# Patient Record
Sex: Male | Born: 1990 | Race: Black or African American | Hispanic: No | Marital: Single | Smoking: Never smoker
Health system: Southern US, Community
[De-identification: ages and names within clinical notes are randomized; demographics above are authoritative.]

---
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
## 2012-06-18 ENCOUNTER — Emergency Department (HOSPITAL_COMMUNITY)
Admission: EM | Admit: 2012-06-18 | Discharge: 2012-06-18 | Disposition: A | Discharge status: Home / Self Care | Payer: Self-pay | Attending: Emergency Medicine | Admitting: Emergency Medicine

## 2012-06-18 ENCOUNTER — Encounter (HOSPITAL_COMMUNITY): Payer: Self-pay | Admitting: Emergency Medicine

## 2012-06-18 DIAGNOSIS — Y9367 Activity, basketball: Secondary | ICD-10-CM | POA: Insufficient documentation

## 2012-06-18 DIAGNOSIS — Y92838 Other recreation area as the place of occurrence of the external cause: Secondary | ICD-10-CM | POA: Insufficient documentation

## 2012-06-18 DIAGNOSIS — W1809XA Striking against other object with subsequent fall, initial encounter: Secondary | ICD-10-CM | POA: Insufficient documentation

## 2012-06-18 DIAGNOSIS — S0180XA Unspecified open wound of other part of head, initial encounter: Secondary | ICD-10-CM | POA: Insufficient documentation

## 2012-06-18 DIAGNOSIS — Y9239 Other specified sports and athletic area as the place of occurrence of the external cause: Secondary | ICD-10-CM | POA: Insufficient documentation

## 2012-06-18 DIAGNOSIS — S0181XA Laceration without foreign body of other part of head, initial encounter: Secondary | ICD-10-CM

## 2012-06-18 MED ORDER — BACITRACIN-NEOMYCIN-POLYMYXIN OINTMENT TUBE
TOPICAL_OINTMENT | Freq: Once | CUTANEOUS | Status: AC
  Administered 2012-06-18: 22:00:00 via TOPICAL
  Filled 2012-06-18: qty 15

## 2012-06-18 NOTE — ED Notes (Signed)
PT. PRESENTS WITH UPPER LIP LACERATION APPROX. 1 INCH LENGTH , TRIPPED AND FELL WHILE PLAYING BASKETBALL -HIT MOUTH AGAINST BLEACHER BENCH , NO LOC . BLEEDING CONTROLLED.

## 2012-06-18 NOTE — ED Notes (Signed)
Patient was playing basketball and he was chasing the ball to keep it from going out of bounds, and he hit a metal bleacher and it lacerated his face.  It is a deep laceration to the area between his upper lip and the tip of his nose.

## 2012-06-18 NOTE — ED Provider Notes (Signed)
History     CSN: [id number]  Arrival date & time 06/18/12  2039   First MD Initiated Contact with Patient 06/18/12 2118      Chief Complaint  Patient presents with  . Lip Laceration    (Consider location/radiation/quality/duration/timing/severity/associated sxs/prior treatment) HPI Comments: Patient states he was playing basketball, when he went to go after a ball that was going out of bounds.  He tripped and fell into the bleachers turning his head to the right, causing him to hit his face on the step in his ear on a support.  He now has a laceration to his upper lip between the nasal pinna and vermilion border as well as to the ear, where the, ear attaches to the scalp  The history is provided by the patient.    History reviewed. No pertinent past medical history.  History reviewed. No pertinent past surgical history.  No family history on file.  History  Substance Use Topics  . Smoking status: Never Smoker   . Smokeless tobacco: Not on file  . Alcohol Use: No      Review of Systems  Constitutional: Negative for activity change.  HENT: Negative for ear pain, neck pain and ear discharge.   Eyes: Negative for visual disturbance.  Respiratory: Negative.   Cardiovascular: Negative.   Gastrointestinal: Negative.   Skin: Positive for wound.  Neurological: Negative for dizziness and headaches.  All other systems reviewed and are negative.    Allergies  Review of patient's allergies indicates no known allergies.  Home Medications  No current outpatient prescriptions on file.  BP 118/71  Pulse 60  Temp 99.5 F (37.5 C) (Oral)  Resp 14  SpO2 100%  Physical Exam  Nursing note and vitals reviewed. Constitutional: He appears well-developed and well-nourished.  HENT:  Head:    Ears:  Eyes: Pupils are equal, round, and reactive to light.  Neck: Normal range of motion.  Cardiovascular: Normal rate.   Pulmonary/Chest: Effort normal.  Musculoskeletal: Normal  range of motion.  Neurological: He is alert.  Skin: Skin is warm and dry.    ED Course  LACERATION REPAIR Date/Time: 06/18/2012 10:08 PM Performed by: Arman Filter Authorized by: Arman Filter Consent: Verbal consent obtained. Risks and benefits: risks, benefits and alternatives were discussed Consent given by: patient Patient understanding: patient states understanding of the procedure being performed Patient identity confirmed: verbally with patient Time out: Immediately prior to procedure a "time out" was called to verify the correct patient, procedure, equipment, support staff and site/side marked as required. Body area: head/neck Location details: nose Laceration length: 1.5 cm Foreign bodies: unknown Tendon involvement: none Nerve involvement: none Vascular damage: no Anesthesia: local infiltration Local anesthetic: lidocaine 1% without epinephrine Anesthetic total: 3 ml Patient sedated: no Irrigation solution: saline Amount of cleaning: standard Debridement: none Degree of undermining: none Skin closure: 6-0 Prolene Number of sutures: 8 Technique: simple Approximation: close Approximation difficulty: simple Dressing: antibiotic ointment Patient tolerance: Patient tolerated the procedure well with no immediate complications.   (including critical care time)  Labs Reviewed - No data to display No results found. LACERATION REPAIR Performed by: Arman Filter Authorized by: Arman Filter Consent: Verbal consent obtained. Risks and benefits: risks, benefits and alternatives were discussed Consent given by: patient Patient identity confirmed: provided demographic data Prepped and Draped in normal sterile fashion Wound explored  Laceration Location: ear  Laceration Length: .75 cm  No Foreign Bodies seen or palpated  Anesthesia: local infiltration  Local anesthetic:  lidocaine 1% woepinephrine  Anesthetic total: 1ml  Irrigation method: syringe Amount  of cleaning: standard  Skin closure:simple  Number of sutures: 3  Technique: interupted  Patient tolerance: Patient tolerated the procedure well with no immediate complications.   1. Laceration of face       MDM  Lacerations         Arman Filter, NP 06/18/12 2214  Arman Filter, NP 06/18/12 2215

## 2012-06-18 NOTE — ED Notes (Signed)
Applied ointment to pts lacerations as instructed by Kathrine Cords., NP. Pt given d/c teaching. Pt has no further questions upon d/c. Pt ambulatory. Pt does not appear to be in any acute distress upon d/c.

## 2012-06-19 NOTE — ED Provider Notes (Signed)
Medical screening examination/treatment/procedure(s) were performed by non-physician practitioner and as supervising physician I was immediately available for consultation/collaboration.  Flint Melter, MD 06/19/12 [phone]

## 2012-06-25 ENCOUNTER — Encounter (HOSPITAL_COMMUNITY): Payer: Self-pay | Admitting: *Deleted

## 2012-06-25 ENCOUNTER — Emergency Department (HOSPITAL_COMMUNITY)
Admission: EM | Admit: 2012-06-25 | Discharge: 2012-06-25 | Disposition: A | Discharge status: Home / Self Care | Payer: Self-pay | Attending: Emergency Medicine | Admitting: Emergency Medicine

## 2012-06-25 DIAGNOSIS — Z4802 Encounter for removal of sutures: Secondary | ICD-10-CM | POA: Insufficient documentation

## 2012-06-25 NOTE — ED Notes (Signed)
Pt incurred a lip laceration on 08/20/11. Received stitches and was told to come back in 7 days to have them removed.

## 2012-06-25 NOTE — ED Provider Notes (Signed)
History    This chart was scribed for Geoffery Lyons, MD, MD by Smitty Pluck, ED Scribe. The patient was seen in room [facility] and the patient's care was started at 3:40PM.   CSN: [id number]  Arrival date & time 06/25/12  1520   None     Chief Complaint  Patient presents with  . Suture / Staple Removal    (Consider location/radiation/quality/duration/timing/severity/associated sxs/prior treatment) The history is provided by the patient. No language interpreter was used.   Cesar Warner is a 21 y.o. male who presents to the Emergency Department due to suture removal from lip. Pt had 8 sutures placed 1 week ago in ED. Pt reports that he tripped and fell into bleachers while playing basketball causing the lip laceration. Pt denies any drainage from area, fever, signs of infection and any other symptoms currently.   History reviewed. No pertinent past medical history.  History reviewed. No pertinent past surgical history.  No family history on file.  History  Substance Use Topics  . Smoking status: Never Smoker   . Smokeless tobacco: Not on file  . Alcohol Use: No      Review of Systems  Constitutional: Negative for fever and chills.  Respiratory: Negative for shortness of breath.   Gastrointestinal: Negative for nausea and vomiting.  Neurological: Negative for weakness.  All other systems reviewed and are negative.    Allergies  Review of patient's allergies indicates no known allergies.  Home Medications  No current outpatient prescriptions on file.  BP 113/77  Pulse 50  Temp 97 F (36.1 C) (Oral)  Resp 16  Ht 6\' 2"  (1.88 m)  Wt 147 lb (66.679 kg)  BMI 18.87 kg/m2  SpO2 100%  Physical Exam  Nursing note and vitals reviewed. Constitutional: He is oriented to person, place, and time. He appears well-developed and well-nourished. No distress.  HENT:  Head: Normocephalic and atraumatic.       Well healed laceration  No signs of infection No erythema  8  sutures in place    Eyes: EOM are normal.  Neck: Neck supple. No tracheal deviation present.  Cardiovascular: Normal rate.   Pulmonary/Chest: Effort normal. No respiratory distress.  Musculoskeletal: Normal range of motion.  Neurological: He is alert and oriented to person, place, and time.  Skin: Skin is warm and dry.  Psychiatric: He has a normal mood and affect. His behavior is normal.    ED Course  Procedures (including critical care time) DIAGNOSTIC STUDIES: Oxygen Saturation is 100% on room air, normal by my interpretation.    COORDINATION OF CARE: 3:43 PM Discussed ED treatment with pt     Labs Reviewed - No data to display No results found.   No diagnosis found.    MDM  Sutures removed, lac looks good.  Return prn.   I personally performed the services described in this documentation, which was scribed in my presence. The recorded information has been reviewed and is accurate.         Geoffery Lyons, MD 06/25/12 [phone]

## 2012-08-22 ENCOUNTER — Emergency Department (HOSPITAL_COMMUNITY)
Admission: EM | Admit: 2012-08-22 | Discharge: 2012-08-22 | Disposition: A | Discharge status: Home / Self Care | Payer: Self-pay | Attending: Emergency Medicine | Admitting: Emergency Medicine

## 2012-08-22 ENCOUNTER — Encounter (HOSPITAL_COMMUNITY): Payer: Self-pay | Admitting: *Deleted

## 2012-08-22 ENCOUNTER — Encounter (HOSPITAL_COMMUNITY): Payer: Self-pay | Admitting: Nurse Practitioner

## 2012-08-22 DIAGNOSIS — Z202 Contact with and (suspected) exposure to infections with a predominantly sexual mode of transmission: Secondary | ICD-10-CM | POA: Insufficient documentation

## 2012-08-22 DIAGNOSIS — Z7251 High risk heterosexual behavior: Secondary | ICD-10-CM | POA: Insufficient documentation

## 2012-08-22 MED ORDER — CEFTRIAXONE SODIUM 1 G IJ SOLR
1.0000 g | Freq: Once | INTRAMUSCULAR | Status: DC
  Filled 2012-08-22: qty 10

## 2012-08-22 MED ORDER — LIDOCAINE HCL (PF) 1 % IJ SOLN
INTRAMUSCULAR | Status: AC
  Administered 2012-08-22: 5 mL
  Filled 2012-08-22: qty 5

## 2012-08-22 MED ORDER — AZITHROMYCIN 1 G PO PACK
1.0000 g | PACK | Freq: Once | ORAL | Status: DC
  Filled 2012-08-22: qty 1

## 2012-08-22 NOTE — ED Provider Notes (Signed)
  Medical screening examination/treatment/procedure(s) were performed by non-physician practitioner and as supervising physician I was immediately available for consultation/collaboration.    Crysten Kaman, MD 08/22/12 2311 

## 2012-08-22 NOTE — ED Provider Notes (Signed)
  Medical screening examination/treatment/procedure(s) were performed by non-physician practitioner and as supervising physician I was immediately available for consultation/collaboration.    Graceanne Guin, MD 08/22/12 2311 

## 2012-08-22 NOTE — ED Notes (Signed)
Pt states girlfriend just tested and treated for G/C std's. Pt wants treatment as well. Pt and girlfriend in long term relationship. Pt denies painful urination or discharge.

## 2012-08-22 NOTE — ED Provider Notes (Signed)
History    This chart was scribed for non-physician practitioner working with Gerhard Munch, MD by Frederik Pear, ED Scribe. This patient was seen in room TR05C/TR05C and the patient's care was started at 1822.   CSN: [id number]  Arrival date & time 08/22/12  1744   First MD Initiated Contact with Patient 08/22/12 1822      No chief complaint on file.   (Consider location/radiation/quality/duration/timing/severity/associated sxs/prior treatment) Patient is a 22 y.o. male presenting with penile discharge. The history is provided by the patient. No language interpreter was used.  Penile Discharge This is a new problem. The current episode started 2 days ago. The problem occurs rarely. The problem has been resolved. Nothing aggravates the symptoms. Nothing relieves the symptoms. He has tried nothing for the symptoms. The treatment provided no relief.    Cesar Warner is a 22 y.o. male who presents to the Emergency Department complaining of sudden onset 1 episode of yellow penile discharge that began 2-3 days ago after having unprotected sex with an individual last week who has seen been diagnosed with chlamydia.Marland Kitchen He denies having additional episodes of penile discharge. He denies any nausea, dysuria, pain, emesis, or h/o of STDs.   History reviewed. No pertinent past medical history.  History reviewed. No pertinent past surgical history.  No family history on file.  History  Substance Use Topics  . Smoking status: Never Smoker   . Smokeless tobacco: Not on file  . Alcohol Use: No      Review of Systems  Gastrointestinal: Negative for nausea and vomiting.  Genitourinary: Positive for discharge. Negative for dysuria.  All other systems reviewed and are negative.    Allergies  Review of patient's allergies indicates no known allergies.  Home Medications  No current outpatient prescriptions on file.  BP 120/88  Pulse 87  Temp(Src) 98.7 F (37.1 C) (Oral)  Resp 18   SpO2 99%  Physical Exam  Nursing note and vitals reviewed. Constitutional: He is oriented to person, place, and time. He appears well-developed and well-nourished. No distress.  HENT:  Head: Normocephalic and atraumatic.  Eyes: EOM are normal. Pupils are equal, round, and reactive to light.  Neck: Normal range of motion. Neck supple. No tracheal deviation present.  Cardiovascular: Normal rate.   Pulmonary/Chest: Effort normal. No respiratory distress.  Abdominal: Soft. He exhibits no distension.  Genitourinary: Testes normal and penis normal. No discharge found.  Musculoskeletal: Normal range of motion. He exhibits no edema.  Lymphadenopathy:       Right: No inguinal adenopathy present.       Left: No inguinal adenopathy present.  Neurological: He is alert and oriented to person, place, and time.  Skin: Skin is warm and dry.  Psychiatric: He has a normal mood and affect. His behavior is normal.    ED Course  Procedures (including critical care time)  DIAGNOSTIC STUDIES: Oxygen Saturation is 99% on room air, normal by my interpretation.    COORDINATION OF CARE:  18:29- Discussed planned course of treatment with the patient, including a GC/chlamydia probe, who is agreeable at this time.   Labs Reviewed - No data to display No results found.   No diagnosis found.  One episode of penile discharge, now resolved.  No dysuria.  Will check urine for GC/Chlamydia--patient will be contacted for treatment options if indicated.  MDM   I personally performed the services described in this documentation, which was scribed in my presence. The recorded information has been reviewed and  is accurate.         Jimmye Norman, NP 08/22/12 2055

## 2012-08-22 NOTE — ED Provider Notes (Signed)
History    This chart was scribed for non-physician practitioner working with Richardean Canal, MD by Frederik Pear, ED Scribe. This patient was seen in room TR10C/TR10C and the patient's care was started at 2219.   CSN: [id number]  Arrival date & time 08/22/12  2155   First MD Initiated Contact with Patient 08/22/12 2219      Chief Complaint  Patient presents with  . SEXUALLY TRANSMITTED DISEASE    (Consider location/radiation/quality/duration/timing/severity/associated sxs/prior treatment) The history is provided by the patient. No language interpreter was used.    Cesar Warner is a 22 y.o. male who presents to the Emergency Department complaining of sudden onset 1 episode of yellow penile discharge that began 2-3 days ago after having unprotected sex earlier today with his gf who was diagnosed with chlamydia. They both deny using any type of birth control. He reports that he was seen in the ED earlier today by Felicie Morn and completed a GC/chlamydia probe. He denies any associated back pain, fever, emesis, or nocturnal hyperhidrosis.  History reviewed. No pertinent past medical history.  History reviewed. No pertinent past surgical history.  History reviewed. No pertinent family history.  History  Substance Use Topics  . Smoking status: Never Smoker   . Smokeless tobacco: Not on file  . Alcohol Use: No      Review of Systems  Constitutional: Negative for fever and chills.  HENT: Negative for sore throat.   Gastrointestinal: Negative for vomiting.  Genitourinary: Positive for discharge.  Musculoskeletal: Negative for back pain.  Skin: Negative for rash.  Neurological: Negative for dizziness.  All other systems reviewed and are negative.    Allergies  Review of patient's allergies indicates no known allergies.  Home Medications  No current outpatient prescriptions on file.  BP 130/93  Pulse 58  Temp(Src) 98.2 F (36.8 C) (Oral)  Resp 16  SpO2  100%  Physical Exam  Nursing note and vitals reviewed. Constitutional: He is oriented to person, place, and time. He appears well-developed and well-nourished. No distress.  HENT:  Head: Normocephalic and atraumatic.  Eyes: EOM are normal. Pupils are equal, round, and reactive to light.  Neck: Normal range of motion. Neck supple. No tracheal deviation present.  Cardiovascular: Normal rate.   Pulmonary/Chest: Effort normal. No respiratory distress.  Abdominal: Soft. He exhibits no distension.  Musculoskeletal: Normal range of motion. He exhibits no edema.  Neurological: He is alert and oriented to person, place, and time.  Skin: Skin is warm and dry.  Psychiatric: He has a normal mood and affect. His behavior is normal.    ED Course  Procedures (including critical care time)  DIAGNOSTIC STUDIES: Oxygen Saturation is 99% on room air, normal by my interpretation.    COORDINATION OF CARE:  22:20- Spoke with Dierdre Forth, provider for the pt's girlfriend, that her pelvic exam was concerning with GC/chlamydia.  22:25- Discussed planned course of treatment with the patient, including Zithromax and Rocephin, who is agreeable at this time.  22:45- Medication Orders- azithromycin (zithromax) powder 1 g- once, ceftriaxone (rocephin) injection 1 g- once.  Labs Reviewed - No data to display No results found.   No diagnosis found.    MDM  STD exposure Patient to be discharged with instructions to follow up with health dept. Discussed importance of using protection when sexually active. Pt understands that they have GC/Chlamydia cultures pending and that they will need to inform all sexual partners if results return positive. Pt has been treated prophylacticly  with azithromycin and rocephin.   I personally performed the services described in this documentation, which was scribed in my presence. The recorded information has been reviewed and is accurate.          Jaci Carrel, [place] 08/22/12 2244

## 2012-08-22 NOTE — ED Notes (Signed)
Given azithromycin 1g oral powder in orange juice and Rocephin 1g IM injection in left upper outer quadrarant of glut. Computer froze during adminstration and unable to pull up CHL

## 2012-08-22 NOTE — ED Notes (Signed)
Pt sts having unprotected sex last night with a an individual that told him she had chlamydia. Pt c/o yellow penile discharge. Denies pain or dysuria.

## 2012-08-22 NOTE — ED Notes (Signed)
Pt discharged.Vital signs stable and GCS 15 

## 2012-08-24 LAB — GC/CHLAMYDIA PROBE AMP
CT Probe RNA: POSITIVE — AB
GC Probe RNA: NEGATIVE

## 2012-08-25 ENCOUNTER — Telehealth (HOSPITAL_COMMUNITY): Payer: Self-pay | Admitting: Emergency Medicine

## 2012-08-25 NOTE — ED Notes (Signed)
+  Chlamydia. Chart sent to EDP office for review. [employer] attached. 

## 2012-08-25 NOTE — ED Notes (Signed)
Patient has +Chlamydia. Checking to see if appropriately treated. °

## 2012-08-27 NOTE — ED Notes (Signed)
Chart returned from EDP office with orders written by Rhea Bleacher for Azithromycin 100 mg po x 1 that needs to be called to pharmacy.

## 2012-08-31 ENCOUNTER — Telehealth (HOSPITAL_COMMUNITY): Payer: Self-pay | Admitting: Emergency Medicine

## 2012-09-29 ENCOUNTER — Telehealth (HOSPITAL_COMMUNITY): Payer: Self-pay | Admitting: Emergency Medicine

## 2012-09-29 NOTE — ED Notes (Signed)
No response to letter sent after 30 days. Chart sent to Medical Records. °

## 2012-10-09 ENCOUNTER — Emergency Department (HOSPITAL_COMMUNITY)
Admission: EM | Admit: 2012-10-09 | Discharge: 2012-10-09 | Disposition: A | Discharge status: Home / Self Care | Payer: Self-pay | Attending: Emergency Medicine | Admitting: Emergency Medicine

## 2012-10-09 ENCOUNTER — Encounter (HOSPITAL_COMMUNITY): Payer: Self-pay

## 2012-10-09 DIAGNOSIS — R21 Rash and other nonspecific skin eruption: Secondary | ICD-10-CM | POA: Insufficient documentation

## 2012-10-09 MED ORDER — DIPHENHYDRAMINE HCL 25 MG PO CAPS
25.0000 mg | ORAL_CAPSULE | Freq: Once | ORAL | Status: AC
  Administered 2012-10-09: 25 mg via ORAL
  Filled 2012-10-09: qty 1

## 2012-10-09 NOTE — ED Notes (Signed)
Pt states he has bumps on left leg and on genitalia since Sunday. Pt states he had unprotected sex 2-3 wks ago and wants to be tested for stds.

## 2012-10-09 NOTE — ED Provider Notes (Signed)
History     CSN: [id number]  Arrival date & time 10/09/12  4098   First MD Initiated Contact with Patient 10/09/12 1910      Chief Complaint  Patient presents with  . Exposure to STD    (Consider location/radiation/quality/duration/timing/severity/associated sxs/prior treatment) HPI Comments: Patient is a 22 year old male who presents today with concern for STDs, new white areas on his penis, and red bumps on his left leg. He noticed the white areas on his penis on Sunday. They are non tender, non pruritic, and causing him no discomfort. The bumps on his legs started this am when he woke up. They are itchy. No drainage or discharge. He states that he has not tried anything new recently, although he has switched his soap from a red soap to an orange soap 1 week ago. He believes the soap is dial brand although he cannot remember. No one else has this rash that he knows of. No fevers, chills, abdominal pain, nausea, vomiting, diarrhea. No urinary symptoms such as dysuria, urgency, or frequency.   Patient is a 22 y.o. male presenting with STD exposure.  Exposure to STD Associated symptoms include a rash. Pertinent negatives include no abdominal pain, chills, fever, nausea or vomiting.    History reviewed. No pertinent past medical history.  History reviewed. No pertinent past surgical history.  History reviewed. No pertinent family history.  History  Substance Use Topics  . Smoking status: Never Smoker   . Smokeless tobacco: Not on file  . Alcohol Use: No      Review of Systems  Constitutional: Negative for fever and chills.  Gastrointestinal: Negative for nausea, vomiting, abdominal pain and diarrhea.  Genitourinary: Negative for dysuria, urgency, hematuria, discharge, penile swelling, scrotal swelling, genital sores, penile pain and testicular pain.  Skin: Positive for color change and rash.  All other systems reviewed and are negative.    Allergies  Review of patient's  allergies indicates no known allergies.  Home Medications  No current outpatient prescriptions on file.  BP 118/72  Pulse 63  Temp(Src) 97.7 F (36.5 C) (Oral)  Ht 6\' 2"  (1.88 m)  Wt 148 lb (67.132 kg)  BMI 18.99 kg/m2  SpO2 98%  Physical Exam  Nursing note and vitals reviewed. Constitutional: He is oriented to person, place, and time. He appears well-developed and well-nourished. No distress.  HENT:  Head: Normocephalic and atraumatic.  Right Ear: External ear normal.  Left Ear: External ear normal.  Nose: Nose normal.  Eyes: Conjunctivae are normal.  Neck: Normal range of motion. No tracheal deviation present.  Cardiovascular: Normal rate, regular rhythm and normal heart sounds.   Pulmonary/Chest: Effort normal and breath sounds normal. No stridor.  Abdominal: Soft. He exhibits no distension. There is no tenderness.  Genitourinary: Testes normal and penis normal. Right testis shows no mass, no swelling and no tenderness. Left testis shows no mass, no swelling and no tenderness. Circumcised. No penile erythema or penile tenderness. No discharge found.  2mm white area on top of penis - nontender, no flaking, no discharge, not raised  Musculoskeletal: Normal range of motion.  Neurological: He is alert and oriented to person, place, and time.  Skin: Skin is warm and dry. Rash noted. He is not diaphoretic.  Several raised areas of erythema on left lateral leg - no discharge, streaking No tenderness to palpation C/W insect bite  Psychiatric: He has a normal mood and affect. His behavior is normal.    ED Course  Procedures (  including critical care time)  Labs Reviewed  GC/CHLAMYDIA PROBE AMP   No results found.   1. Rash       MDM  Patient presents today after presenting 2/13 for STD testing where he tested positive for chlamydia. He was treated appropriately at that time. He denies any new sexual contacts. The importance of practicing safe sex was discussed.  GC/Chlamydia probe pending. Holding treatment at this time. Rash on leg consistent with insect bite. Wash sheets well. Benadryl PRN for itching. Benadryl will make you sleepy. Do not drive on Benadryl. Return precautions given such as fever, spreading of the rash, increased pain. Patient / Family / Caregiver informed of clinical course, understand medical decision-making process, and agree with plan.        Mora Bellman, PA-C 10/09/12 2053

## 2012-10-09 NOTE — ED Provider Notes (Signed)
Medical screening examination/treatment/procedure(s) were conducted as a shared visit with non-physician practitioner(s) and myself.  I personally evaluated the patient during the encounter   Loren Racer, MD 10/09/12 [phone]

## 2012-10-09 NOTE — ED Notes (Addendum)
Pt has large red bumps noted to outer left thigh. Pt states that rash on left leg is itchy. Pt also has "small white bumps" noted to penis. These are not itchy. Denies any d/c.

## 2012-12-11 ENCOUNTER — Emergency Department (INDEPENDENT_AMBULATORY_CARE_PROVIDER_SITE_OTHER)
Admission: EM | Admit: 2012-12-11 | Discharge: 2012-12-11 | Disposition: A | Discharge status: Home / Self Care | Payer: Self-pay | Source: Home / Self Care | Attending: Family Medicine | Admitting: Family Medicine

## 2012-12-11 ENCOUNTER — Encounter (HOSPITAL_COMMUNITY): Payer: Self-pay | Admitting: Emergency Medicine

## 2012-12-11 DIAGNOSIS — J020 Streptococcal pharyngitis: Secondary | ICD-10-CM

## 2012-12-11 LAB — POCT RAPID STREP A: Streptococcus, Group A Screen (Direct): POSITIVE — AB

## 2012-12-11 MED ORDER — DEXAMETHASONE SODIUM PHOSPHATE 10 MG/ML IJ SOLN
INTRAMUSCULAR | Status: AC
  Filled 2012-12-11: qty 1

## 2012-12-11 MED ORDER — DEXAMETHASONE SODIUM PHOSPHATE 10 MG/ML IJ SOLN
10.0000 mg | Freq: Once | INTRAMUSCULAR | Status: AC
  Administered 2012-12-11: 10 mg via INTRAMUSCULAR

## 2012-12-11 MED ORDER — AMOXICILLIN 875 MG PO TABS: 875.0000 mg | ORAL_TABLET | Freq: Two times a day (BID) | ORAL | Status: DC

## 2012-12-11 MED ORDER — DPH-LIDO-ALHYDR-MGHYDR-SIMETH MT SUSP: 5.0000 mL | OROMUCOSAL | Status: DC | PRN

## 2012-12-11 NOTE — ED Notes (Signed)
To pharmacist in regards to magic mouthwash solution. They do not have any lidocaine based mouthwash but she can mix a solution similar to magic mouthwash with lidocaine and maalox. With the same instructions. Also informed pharmacist to let patient know he needs to come back to the urgent care for tx.

## 2012-12-11 NOTE — ED Notes (Signed)
Pt c/o sore throat with swollen lymphnodes and fever x 2 days. Girlfriend has had strep throat x 5 days. Pt is alert and oriented.

## 2012-12-11 NOTE — ED Provider Notes (Signed)
History     CSN: [id number]  Arrival date & time 12/11/12  1001   None     Chief Complaint  Patient presents with  . Sore Throat    (Consider location/radiation/quality/duration/timing/severity/associated sxs/prior treatment) HPI Comments: Pt presents c/o sore throat, subjective fever/chills, and swollen painful lymph node on the right side of his neck for 2 days.  His girlfriend has strep throat right now as well, diagnosed 5 days ago.  Denies any other symptoms.    Patient is a 22 y.o. male presenting with pharyngitis.  Sore Throat Pertinent negatives include no chest pain, no abdominal pain and no shortness of breath.    History reviewed. No pertinent past medical history.  History reviewed. No pertinent past surgical history.  History reviewed. No pertinent family history.  History  Substance Use Topics  . Smoking status: Never Smoker   . Smokeless tobacco: Not on file  . Alcohol Use: No      Review of Systems  Constitutional: Positive for fever and chills. Negative for fatigue.  HENT: Positive for sore throat. Negative for neck pain and neck stiffness.   Eyes: Negative for visual disturbance.  Respiratory: Negative for cough and shortness of breath.   Cardiovascular: Negative for chest pain, palpitations and leg swelling.  Gastrointestinal: Negative for nausea, vomiting, abdominal pain, diarrhea and constipation.  Genitourinary: Negative for dysuria, urgency, frequency and hematuria.  Musculoskeletal: Negative for myalgias and arthralgias.  Skin: Negative for rash.  Neurological: Negative for dizziness, weakness and light-headedness.    Allergies  Review of patient's allergies indicates no known allergies.  Home Medications   Current Outpatient Rx  Name  Route  Sig  Dispense  Refill  . amoxicillin (AMOXIL) 875 MG tablet   Oral   Take 1 tablet (875 mg total) by mouth 2 (two) times daily.   14 tablet   0   . DPH-Lido-AlHydr-MgHydr-Simeth SUSP  Mouth/Throat   Use as directed 5 mLs in the mouth or throat every hour as needed (gargle and spit).   200 mL   0     BP 127/61  Pulse 57  Temp(Src) 98.3 F (36.8 C) (Oral)  Resp 16  SpO2 99%  Physical Exam  Nursing note and vitals reviewed. Constitutional: He is oriented to person, place, and time. He appears well-developed and well-nourished. No distress.  HENT:  Head: Normocephalic and atraumatic.  Mouth/Throat: Uvula is midline. Posterior oropharyngeal erythema present. No oropharyngeal exudate, posterior oropharyngeal edema or tonsillar abscesses.  Eyes: EOM are normal. Pupils are equal, round, and reactive to light.  Cardiovascular: Normal rate and regular rhythm.  Exam reveals no gallop and no friction rub.   No murmur heard. Pulmonary/Chest: Effort normal and breath sounds normal. No respiratory distress. He has no wheezes. He has no rales.  Abdominal: Soft. There is no tenderness.  Lymphadenopathy:       Head (right side): Tonsillar adenopathy present. No submandibular adenopathy present.       Head (left side): Tonsillar adenopathy present. No submandibular adenopathy present.    He has no cervical adenopathy.  Neurological: He is oriented to person, place, and time.  Skin: Skin is warm and dry. No rash noted.  Psychiatric: He has a normal mood and affect. Judgment normal.    ED Course  Procedures (including critical care time)  Labs Reviewed  POCT RAPID STREP A ([facility] CARE ONLY) - Abnormal; Notable for the following:    Streptococcus, Group A Screen (Direct) POSITIVE (*)  All other components within normal limits   No results found.   1. Strep pharyngitis       MDM  Pt has strep pharyngitis.   Giving 10 mg IM decadron in office and ABx/magic mouthwash.   Pt instructed to turn for worsening symptoms after a couple days of ABx if needed    Meds ordered this encounter  Medications  . dexamethasone (DECADRON) injection 10 mg    Sig:   . amoxicillin  (AMOXIL) 875 MG tablet    Sig: Take 1 tablet (875 mg total) by mouth 2 (two) times daily.    Dispense:  14 tablet    Refill:  0  . DPH-Lido-AlHydr-MgHydr-Simeth SUSP    Sig: Use as directed 5 mLs in the mouth or throat every hour as needed (gargle and spit).    Dispense:  200 mL    Refill:  0           Graylon Good, PA-C 12/11/12 1039

## 2012-12-11 NOTE — Discharge Instructions (Signed)
Strep Throat  Strep throat is an infection of the throat caused by a bacteria named Streptococcus pyogenes. Your caregiver may call the infection streptococcal "tonsillitis" or "pharyngitis" depending on whether there are signs of inflammation in the tonsils or back of the throat. Strep throat is most common in children aged 21 21 years during the cold months of the year, but it can occur in people of any age during any season. This infection is spread from person to person (contagious) through coughing, sneezing, or other close contact.  SYMPTOMS   · Fever or chills.  · Painful, swollen, red tonsils or throat.  · Pain or difficulty when swallowing.  · White or yellow spots on the tonsils or throat.  · Swollen, tender lymph nodes or "glands" of the neck or under the jaw.  · Red rash all over the body (rare).  DIAGNOSIS   Many different infections can cause the same symptoms. A test must be done to confirm the diagnosis so the right treatment can be given. A "rapid strep test" can help your caregiver make the diagnosis in a few minutes. If this test is not available, a light swab of the infected area can be used for a throat culture test. If a throat culture test is done, results are usually available in a day or two.  TREATMENT   Strep throat is treated with antibiotic medicine.  HOME CARE INSTRUCTIONS   · Gargle with 1 tsp of salt in 1 cup of warm water, 3 4 times per day or as needed for comfort.  · Family members who also have a sore throat or fever should be tested for strep throat and treated with antibiotics if they have the strep infection.  · Make sure everyone in your household washes their hands well.  · Do not share food, drinking cups, or personal items that could cause the infection to spread to others.  · You may need to eat a soft food diet until your sore throat gets better.  · Drink enough water and fluids to keep your urine clear or pale yellow. This will help prevent dehydration.  · Get plenty of  rest.  · Stay home from school, daycare, or work until you have been on antibiotics for 24 hours.  · Only take over-the-counter or prescription medicines for pain, discomfort, or fever as directed by your caregiver.  · If antibiotics are prescribed, take them as directed. Finish them even if you start to feel better.  SEEK MEDICAL CARE IF:   · The glands in your neck continue to enlarge.  · You develop a rash, cough, or earache.  · You cough up green, yellow-brown, or bloody sputum.  · You have pain or discomfort not controlled by medicines.  · Your problems seem to be getting worse rather than better.  SEEK IMMEDIATE MEDICAL CARE IF:   · You develop any new symptoms such as vomiting, severe headache, stiff or painful neck, chest pain, shortness of breath, or trouble swallowing.  · You develop severe throat pain, drooling, or changes in your voice.  · You develop swelling of the neck, or the skin on the neck becomes red and tender.  · You have a fever.  · You develop signs of dehydration, such as fatigue, dry mouth, and decreased urination.  · You become increasingly sleepy, or you cannot wake up completely.  Document Released: 06/23/2000 Document Revised: 06/12/2012 Document Reviewed: 08/25/2010  ExitCare® Patient Information ©2014 [employer].

## 2012-12-11 NOTE — ED Notes (Signed)
Patient did not get injection. Lorretta Harp, CMA

## 2012-12-11 NOTE — ED Notes (Signed)
Patient came back and received injection in right deltoid. Pt tolerated well. Given work note per request.

## 2012-12-12 NOTE — ED Provider Notes (Signed)
Medical screening examination/treatment/procedure(s) were performed by non-physician practitioner and as supervising physician I was immediately available for consultation/collaboration.   [facility]; MD  Sharin Grave, MD 12/12/12 1009

## 2014-05-30 ENCOUNTER — Encounter (HOSPITAL_COMMUNITY): Payer: Self-pay | Admitting: Emergency Medicine

## 2014-05-30 ENCOUNTER — Emergency Department (HOSPITAL_COMMUNITY)
Admission: EM | Admit: 2014-05-30 | Discharge: 2014-05-30 | Disposition: A | Discharge status: Home / Self Care | Payer: Self-pay | Attending: Emergency Medicine | Admitting: Emergency Medicine

## 2014-05-30 DIAGNOSIS — Y998 Other external cause status: Secondary | ICD-10-CM | POA: Insufficient documentation

## 2014-05-30 DIAGNOSIS — W2101XA Struck by football, initial encounter: Secondary | ICD-10-CM | POA: Insufficient documentation

## 2014-05-30 DIAGNOSIS — IMO0002 Reserved for concepts with insufficient information to code with codable children: Secondary | ICD-10-CM

## 2014-05-30 DIAGNOSIS — Z792 Long term (current) use of antibiotics: Secondary | ICD-10-CM | POA: Insufficient documentation

## 2014-05-30 DIAGNOSIS — S0181XA Laceration without foreign body of other part of head, initial encounter: Secondary | ICD-10-CM | POA: Insufficient documentation

## 2014-05-30 DIAGNOSIS — Y9361 Activity, american tackle football: Secondary | ICD-10-CM | POA: Insufficient documentation

## 2014-05-30 DIAGNOSIS — Y92321 Football field as the place of occurrence of the external cause: Secondary | ICD-10-CM | POA: Insufficient documentation

## 2014-05-30 MED ORDER — LIDOCAINE-EPINEPHRINE-TETRACAINE (LET) SOLUTION
3.0000 mL | Freq: Once | NASAL | Status: DC
Start: 2014-05-30 — End: 2014-05-30

## 2014-05-30 MED ORDER — ONDANSETRON 4 MG PO TBDP
4.0000 mg | ORAL_TABLET | Freq: Once | ORAL | Status: AC
  Administered 2014-05-30: 4 mg via ORAL
  Filled 2014-05-30: qty 1

## 2014-05-30 MED ORDER — LIDOCAINE-EPINEPHRINE (PF) 1 %-1:200000 IJ SOLN
10.0000 mL | Freq: Once | INTRAMUSCULAR | Status: AC
  Administered 2014-05-30: 10 mL
  Filled 2014-05-30: qty 10

## 2014-05-30 MED ORDER — ONDANSETRON HCL 4 MG/2ML IJ SOLN: 4.0000 mg | Freq: Once | INTRAMUSCULAR | Status: DC

## 2014-05-30 NOTE — ED Provider Notes (Signed)
CSN: [id number]     Arrival date & time 05/30/14  1505 History  This chart was scribed for [employer] Corporation[employer]ole Jahmiyah Dullea, PA-C, working with American Expressathan R. Rubin PayorPickering, MD found by Elon SpannerGarrett Cook, ED Scribe. This patient was seen in room TR06C/TR06C and the patient's care was started at 3:26 PM.  Chief Complaint  Patient presents with  . Head Laceration   HPI HPI Comments: Cesar Warner is a 23 y.o. male who presents to the Emergency Department complaining of a facial laceration within his right eyebrow sustained 45 minutes ago.  Patient states he was playing football when, after a tackle, a fellow player's cleat made contact with his face.  Patient denies LOC, vision changes, cervicalgia.  Patient reports he received a tetanus shot earlier this year.     History reviewed. No pertinent past medical history. History reviewed. No pertinent past surgical history. History reviewed. No pertinent family history. History  Substance Use Topics  . Smoking status: Never Smoker   . Smokeless tobacco: Not on file  . Alcohol Use: No    Review of Systems A complete 10 system review of systems was obtained and all systems are negative except as noted in the HPI and PMH.    Allergies  Review of patient's allergies indicates no known allergies.  Home Medications   Prior to Admission medications   Medication Sig Start Date End Date Taking? Authorizing Provider  amoxicillin (AMOXIL) 875 MG tablet Take 1 tablet (875 mg total) by mouth 2 (two) times daily. 12/11/12   Graylon GoodZachary H Baker, PA-C  DPH-Lido-AlHydr-MgHydr-Simeth SUSP Use as directed 5 mLs in the mouth or throat every hour as needed (gargle and spit). 12/11/12   Adrian BlackwaterZachary H Baker, PA-C   BP 134/77 mmHg  Pulse 65  Temp(Src) 98.2 F (36.8 C)  Resp 16  SpO2 99% Physical Exam  Constitutional: He is oriented to person, place, and time. He appears well-developed and well-nourished. No distress.  HENT:  Head: Normocephalic.    Mouth/Throat: Oropharynx is clear and  moist.  1.25cm full thickness laceration without FB, bleeding controlled, no exposed bone. To TTP or crepitance along the orbital rim. No occular trauma. EOMI intact without pain or diplopia. PERRL.  .   No hemotympanum, battle signs or raccoon's eyes  No abnormal otorrhea or rhinorrhea. Nasal septum midline.  No intraoral trauma.      Eyes: Conjunctivae and EOM are normal. Pupils are equal, round, and reactive to light.  Neck: Normal range of motion. Neck supple.  Cardiovascular: Normal rate and regular rhythm.   Pulmonary/Chest: Effort normal and breath sounds normal. No stridor. He exhibits no tenderness.  Abdominal: Soft. There is no tenderness.  Musculoskeletal: Normal range of motion.  Neurological: He is alert and oriented to person, place, and time.  Psychiatric: He has a normal mood and affect.  Nursing note and vitals reviewed.   ED Course  Procedures (including critical care time)  DIAGNOSTIC STUDIES: Oxygen Saturation is 99% on RA, normal by my interpretation.    COORDINATION OF CARE:  3:27 PM Informed patient of plan to provide laceration repair.  Patient acknowledges and agrees with plan.    LACERATION REPAIR PROCEDURE NOTE The patient's identification was confirmed and consent was obtained. This procedure was performed by Gennette PacNicole Pisciatta, PA-C, at 3:28 PM. Site: right eyebrow Sterile procedures observed Anesthetic used (type and amt): Lidocaine 1% with epi Suture type/size: 6-0 vicryl Length:1.25cm # of Sutures: 3 Technique: simple interupted Complexity simple Antibx ointment applied yes Tetanus UTD or  ordered UTD Site anesthetized, irrigated with NS, explored without evidence of foreign body, wound well approximated, site covered with dry, sterile dressing.  Patient tolerated procedure well without complications. Instructions for care discussed verbally and patient provided with additional written instructions for homecare and f/u.  Labs  Review Labs Reviewed - No data to display  Imaging Review No results found.   EKG Interpretation None      MDM   Final diagnoses:  Laceration    Filed Vitals:   05/30/14 1509  BP: 134/77  Pulse: 65  Temp: 98.2 F (36.8 C)  Resp: 16  SpO2: 99%    Medications  lidocaine-EPINEPHrine (XYLOCAINE-EPINEPHrine) 1 %-1:200000 (PF) injection 10 mL (10 mLs Other Given 05/30/14 1633)  ondansetron (ZOFRAN-ODT) disintegrating tablet 4 mg (4 mg Oral Given 05/30/14 1615)    Cesar Warner is a 23 y.o. male presenting with laceration to right eyebrow, no complication factors. Pt was in significantly pain from lidocaine injection. Had single episode of emesis after injection. ODT given with good relief and wound closed without issue.   Evaluation does not show pathology that would require ongoing emergent intervention or inpatient treatment. Pt is hemodynamically stable and mentating appropriately. Discussed findings and plan with patient/guardian, who agrees with care plan. All questions answered. Return precautions discussed and outpatient follow up given.   I personally performed the services described in this documentation, which was scribed in my presence. The recorded information has been reviewed and is accurate.    Wynetta Emeryicole Coy Vandoren, PA-C 06/01/14 1057  Juliet RudeNathan R. Rubin PayorPickering, MD 06/08/14 [id number]

## 2014-05-30 NOTE — Discharge Instructions (Signed)
Keep wound dry and do not remove dressing for 24 hours if possible. After that, wash gently morning and night (every 12 hours) with soap and water. Use a topical antibiotic ointment and cover with a bandaid or gauze.    Do NOT use rubbing alcohol or hydrogen peroxide, do not soak the area   Every attempt was made to remove foreign body (contaminants) from the wound.  However, there is always a chance that some may remain in the wound. This can  increase your risk of infection.   If you see signs of infection (warmth, redness, tenderness, pus, sharp increase in pain, fever, red streaking in the skin) immediately return to the emergency department.   After the wound heals fully, apply sunscreen for 6-12 months to minimize scarring.    Laceration Care, Adult A laceration is a cut that goes through all layers of the skin. The cut goes into the tissue beneath the skin. HOME CARE For stitches (sutures) or staples:  Keep the cut clean and dry.  If you have a bandage (dressing), change it at least once a day. Change the bandage if it gets wet or dirty, or as told by your doctor.  Wash the cut with soap and water 2 times a day. Rinse the cut with water. Pat it dry with a clean towel.  Put a thin layer of medicated cream on the cut as told by your doctor.  You may shower after the first 24 hours. Do not soak the cut in water until the stitches are removed.  Only take medicines as told by your doctor.  Have your stitches or staples removed as told by your doctor. For skin adhesive strips:  Keep the cut clean and dry.  Do not get the strips wet. You may take a bath, but be careful to keep the cut dry.  If the cut gets wet, pat it dry with a clean towel.  The strips will fall off on their own. Do not remove the strips that are still stuck to the cut. For wound glue:  You may shower or take baths. Do not soak or scrub the cut. Do not swim. Avoid heavy sweating until the glue falls off on  its own. After a shower or bath, pat the cut dry with a clean towel.  Do not put medicine on your cut until the glue falls off.  If you have a bandage, do not put tape over the glue.  Avoid lots of sunlight or tanning lamps until the glue falls off. Put sunscreen on the cut for the first year to reduce your scar.  The glue will fall off on its own. Do not pick at the glue. You may need a tetanus shot if:  You cannot remember when you had your last tetanus shot.  You have never had a tetanus shot. If you need a tetanus shot and you choose not to have one, you may get tetanus. Sickness from tetanus can be serious. GET HELP RIGHT AWAY IF:   Your pain does not get better with medicine.  Your arm, hand, leg, or foot loses feeling (numbness) or changes color.  Your cut is bleeding.  Your joint feels weak, or you cannot use your joint.  You have painful lumps on your body.  Your cut is red, puffy (swollen), or painful.  You have a red line on the skin near the cut.  You have yellowish-white fluid (pus) coming from the cut.  You have a  fever.  You have a bad smell coming from the cut or bandage.  Your cut breaks open before or after stitches are removed.  You notice something coming out of the cut, such as wood or glass.  You cannot move a finger or toe. MAKE SURE YOU:   Understand these instructions.  Will watch your condition.  Will get help right away if you are not doing well or get worse. Document Released: 12/13/2007 Document Revised: 09/18/2011 Document Reviewed: 12/20/2010 [facility] Patient Information 2015 [place]. This information is not intended to replace advice given to you by your health care provider. Make sure you discuss any questions you have with your health care provider.

## 2014-05-30 NOTE — ED Notes (Signed)
Pt c/o right eyebrow laceration after playing football; pt sts last tetanus was this year

## 2014-05-30 NOTE — ED Notes (Signed)
Declined W/C at D/C and was escorted to lobby by RN. 

## 2014-12-08 ENCOUNTER — Emergency Department (HOSPITAL_COMMUNITY)
Admission: EM | Admit: 2014-12-08 | Discharge: 2014-12-08 | Disposition: A | Discharge status: Home / Self Care | Payer: Self-pay | Attending: Emergency Medicine | Admitting: Emergency Medicine

## 2014-12-08 ENCOUNTER — Encounter (HOSPITAL_COMMUNITY): Payer: Self-pay | Admitting: Emergency Medicine

## 2014-12-08 DIAGNOSIS — Z792 Long term (current) use of antibiotics: Secondary | ICD-10-CM | POA: Insufficient documentation

## 2014-12-08 DIAGNOSIS — N390 Urinary tract infection, site not specified: Secondary | ICD-10-CM | POA: Insufficient documentation

## 2014-12-08 LAB — CBC WITH DIFFERENTIAL/PLATELET
Basophils Absolute: 0 10*3/uL (ref 0.0–0.1)
Basophils Relative: 0 % (ref 0–1)
EOS ABS: 0.2 10*3/uL (ref 0.0–0.7)
EOS PCT: 2 % (ref 0–5)
HEMATOCRIT: 38.4 % — AB (ref 39.0–52.0)
HEMOGLOBIN: 13.9 g/dL (ref 13.0–17.0)
LYMPHS ABS: 2.7 10*3/uL (ref 0.7–4.0)
Lymphocytes Relative: 27 % (ref 12–46)
MCH: 29.8 pg (ref 26.0–34.0)
MCHC: 36.2 g/dL — ABNORMAL HIGH (ref 30.0–36.0)
MCV: 82.4 fL (ref 78.0–100.0)
MONO ABS: 0.7 10*3/uL (ref 0.1–1.0)
MONOS PCT: 7 % (ref 3–12)
NEUTROS PCT: 64 % (ref 43–77)
Neutro Abs: 6.3 10*3/uL (ref 1.7–7.7)
Platelets: 241 10*3/uL (ref 150–400)
RBC: 4.66 MIL/uL (ref 4.22–5.81)
RDW: 12.6 % (ref 11.5–15.5)
WBC: 9.8 10*3/uL (ref 4.0–10.5)

## 2014-12-08 LAB — URINALYSIS, ROUTINE W REFLEX MICROSCOPIC
GLUCOSE, UA: NEGATIVE mg/dL
KETONES UR: 15 mg/dL — AB
Nitrite: POSITIVE — AB
PH: 8.5 — AB (ref 5.0–8.0)
Protein, ur: 100 mg/dL — AB
Specific Gravity, Urine: 1.021 (ref 1.005–1.030)
Urobilinogen, UA: 1 mg/dL (ref 0.0–1.0)

## 2014-12-08 LAB — BASIC METABOLIC PANEL
Anion gap: 12 (ref 5–15)
BUN: 8 mg/dL (ref 6–20)
CALCIUM: 9.4 mg/dL (ref 8.9–10.3)
CO2: 25 mmol/L (ref 22–32)
CREATININE: 1.08 mg/dL (ref 0.61–1.24)
Chloride: 104 mmol/L (ref 101–111)
GFR calc Af Amer: >60 mL/min (ref >60)
GLUCOSE: 86 mg/dL (ref 65–99)
Potassium: 4.1 mmol/L (ref 3.5–5.1)
SODIUM: 141 mmol/L (ref 135–145)

## 2014-12-08 LAB — URINE MICROSCOPIC-ADD ON

## 2014-12-08 MED ORDER — SULFAMETHOXAZOLE-TRIMETHOPRIM 800-160 MG PO TABS: 1.0000 | ORAL_TABLET | Freq: Two times a day (BID) | ORAL | Status: AC

## 2014-12-08 NOTE — ED Notes (Signed)
Pt states that he woke up tonight to urinate and reported bright red blood in his urine. Pt reports dysuria, and pelvic pain.

## 2014-12-08 NOTE — ED Notes (Signed)
Dr. Docherty at bedside.

## 2014-12-08 NOTE — Discharge Instructions (Signed)

## 2014-12-10 NOTE — ED Provider Notes (Signed)
CSN: [id number]     Arrival date & time 12/08/14  0425 History   First MD Initiated Contact with Patient 12/08/14 0534     Chief Complaint  Patient presents with  . Hematuria     (Consider location/radiation/quality/duration/timing/severity/associated sxs/prior Treatment) HPI Comments: Pt woke up at night to pee, had dysuria and hematuria. No back pain, pelvic pain or abdominal pain, no fever, GU pain or lesions. No hx of similar.   Patient is a 24 y.o. male presenting with hematuria.  Hematuria Pertinent negatives include no chest pain, no abdominal pain, no headaches and no shortness of breath.    History reviewed. No pertinent past medical history. History reviewed. No pertinent past surgical history. History reviewed. No pertinent family history. History  Substance Use Topics  . Smoking status: Never Smoker   . Smokeless tobacco: Not on file  . Alcohol Use: No    Review of Systems  Constitutional: Negative for fever, activity change, appetite change and fatigue.  HENT: Negative for congestion, facial swelling, rhinorrhea and trouble swallowing.   Eyes: Negative for photophobia and pain.  Respiratory: Negative for cough, chest tightness and shortness of breath.   Cardiovascular: Negative for chest pain and leg swelling.  Gastrointestinal: Negative for nausea, vomiting, abdominal pain, diarrhea and constipation.  Endocrine: Negative for polydipsia and polyuria.  Genitourinary: Positive for dysuria and hematuria. Negative for urgency, decreased urine volume and difficulty urinating.  Musculoskeletal: Negative for back pain and gait problem.  Skin: Negative for color change, rash and wound.  Allergic/Immunologic: Negative for immunocompromised state.  Neurological: Negative for dizziness, facial asymmetry, speech difficulty, weakness, numbness and headaches.  Psychiatric/Behavioral: Negative for confusion, decreased concentration and agitation.      Allergies  Review of  patient's allergies indicates no known allergies.  Home Medications   Prior to Admission medications   Medication Sig Start Date End Date Taking? Authorizing Provider  amoxicillin (AMOXIL) 875 MG tablet Take 1 tablet (875 mg total) by mouth 2 (two) times daily. 12/11/12   Graylon Good, PA-C  DPH-Lido-AlHydr-MgHydr-Simeth SUSP Use as directed 5 mLs in the mouth or throat every hour as needed (gargle and spit). 12/11/12   Graylon Good, PA-C  sulfamethoxazole-trimethoprim (BACTRIM DS,SEPTRA DS) 800-160 MG per tablet Take 1 tablet by mouth 2 (two) times daily. 12/08/14 12/15/14  Toy Cookey, MD   BP 116/69 mmHg  Pulse 61  Temp(Src) 97.8 F (36.6 C) (Oral)  Resp 10  Ht  (1.88 m)  Wt 170 lb (77.111 kg)  BMI 21.82 kg/m2  SpO2 99% Physical Exam  Constitutional: He is oriented to person, place, and time. He appears well-developed and well-nourished. No distress.  HENT:  Head: Normocephalic and atraumatic.  Mouth/Throat: No oropharyngeal exudate.  Eyes: Pupils are equal, round, and reactive to light.  Neck: Normal range of motion. Neck supple.  Cardiovascular: Normal rate, regular rhythm and normal heart sounds.  Exam reveals no gallop and no friction rub.   No murmur heard. Pulmonary/Chest: Effort normal and breath sounds normal. No respiratory distress. He has no wheezes. He has no rales.  Abdominal: Soft. Bowel sounds are normal. He exhibits no distension and no mass. There is no tenderness. There is no rebound and no guarding.  Genitourinary:  nml  Musculoskeletal: Normal range of motion. He exhibits no edema or tenderness.  Neurological: He is alert and oriented to person, place, and time.  Skin: Skin is warm and dry.  Psychiatric: He has a normal mood and affect.  ED Course  Procedures (including critical care time) Labs Review Labs Reviewed  URINALYSIS, ROUTINE W REFLEX MICROSCOPIC (NOT [facility]) - Abnormal; Notable for the following:    Color, Urine RED (*)     APPearance TURBID (*)    pH 8.5 (*)    Hgb urine dipstick LARGE (*)    Bilirubin Urine SMALL (*)    Ketones, ur 15 (*)    Protein, ur 100 (*)    Nitrite POSITIVE (*)    Leukocytes, UA LARGE (*)    All other components within normal limits  CBC WITH DIFFERENTIAL/PLATELET - Abnormal; Notable for the following:    HCT 38.4 (*)    MCHC 36.2 (*)    All other components within normal limits  URINE MICROSCOPIC-ADD ON - Abnormal; Notable for the following:    Bacteria, UA MANY (*)    All other components within normal limits  URINE CULTURE  BASIC METABOLIC PANEL  GC/CHLAMYDIA PROBE AMP ([place]) NOT AT [facility]    Imaging Review No results found.   EKG Interpretation None      MDM   Final diagnoses:  UTI (lower urinary tract infection)    Pt is a 24 y.o. male with Pmhx as above who presents with 1 days of dysuria/hematuria w/o fever, chills, abdominal pain, back pain, penile discharge. GU exam, ab exam nml. UA appears infected. CBC, BMP nml. Urine GC/Chlam sent, pt placed on abx.      Lessie DingsJames J Salome evaluation in the Emergency Department is complete. It has been determined that no acute conditions requiring further emergency intervention are present at this time. The patient/guardian have been advised of the diagnosis and plan. We have discussed signs and symptoms that warrant return to the ED, such as changes or worsening in symptoms, worsening pain, fever, failure of symptoms to resolve.       Toy CookeyMegan Docherty, MD 12/10/14 1209

## 2014-12-11 LAB — URINE CULTURE

## 2014-12-12 NOTE — Progress Notes (Signed)
ED Antimicrobial Stewardship Positive Culture Follow Up   Lessie DingsJames J Meuser is an 24 y.o. male who [facility] to [facility] with a chief complaint of  Chief Complaint  Patient presents with  . Hematuria    Recent Results (from the past 720 hour(s))  Urine culture     Status: None   Collection Time: 12/08/14  4:39 AM  Result Value Ref Range Status   Specimen Description URINE, RANDOM  Final   Special Requests NONE  Final   Colony Count   Final    35,000 COLONIES/ML Performed at [facility]    Culture   Final    ENTEROBACTER AEROGENES ESCHERICHIA COLI Performed at [facility]    Report Status 12/11/2014 FINAL  Final   Organism ID, Bacteria ENTEROBACTER AEROGENES  Final   Organism ID, Bacteria ESCHERICHIA COLI  Final      Susceptibility   Enterobacter aerogenes - MIC*    CEFAZOLIN >=64 RESISTANT Resistant     CEFTRIAXONE <=1 SENSITIVE Sensitive     CIPROFLOXACIN <=0.25 SENSITIVE Sensitive     GENTAMICIN <=1 SENSITIVE Sensitive     LEVOFLOXACIN <=0.12 SENSITIVE Sensitive     NITROFURANTOIN 128 RESISTANT Resistant     TOBRAMYCIN <=1 SENSITIVE Sensitive     TRIMETH/SULFA <=20 SENSITIVE Sensitive     PIP/TAZO 8 SENSITIVE Sensitive     * ENTEROBACTER AEROGENES   Escherichia coli - MIC*    AMPICILLIN >=32 RESISTANT Resistant     CEFAZOLIN <=4 SENSITIVE Sensitive     CEFTRIAXONE <=1 SENSITIVE Sensitive     CIPROFLOXACIN 0.5 SENSITIVE Sensitive     GENTAMICIN <=1 SENSITIVE Sensitive     LEVOFLOXACIN 1 SENSITIVE Sensitive     NITROFURANTOIN <=16 SENSITIVE Sensitive     TOBRAMYCIN <=1 SENSITIVE Sensitive     TRIMETH/SULFA >=320 RESISTANT Resistant     PIP/TAZO <=4 SENSITIVE Sensitive     * ESCHERICHIA COLI    [x]  Treated with Septra, organism resistant to prescribed antimicrobial  New antibiotic prescription: Cipro 500mg  PO BID x 7 days.  Patient should stop Septra.  ED Provider: Rhea BleacherJosh Geiple, PA-C   Sallee Provencalurner, Cloyde Oregel S 12/12/2014, 9:56  AM Infectious Diseases Pharmacist Phone# [phone]

## 2014-12-13 ENCOUNTER — Telehealth: Payer: Self-pay | Admitting: Emergency Medicine

## 2014-12-14 ENCOUNTER — Telehealth (HOSPITAL_BASED_OUTPATIENT_CLINIC_OR_DEPARTMENT_OTHER): Payer: Self-pay | Admitting: Emergency Medicine

## 2014-12-14 NOTE — Telephone Encounter (Signed)
Post ED Visit - Positive Culture Follow-up: Successful Patient Follow-Up  Culture assessed and recommendations reviewed by: []  Celedonio MiyamotoJeremy Frens, Pharm.D., BCPS-AQ ID []  Georgina PillionElizabeth Martin, Pharm.D., BCPS []  EddingtonMinh Pham, Vermont[place].D., BCPS, AAHIVP [x]  Estella HuskMichelle Turner, Pharm.D., BCPS, AAHIVP []  Tegan Magsam, Pharm.D. []  Tennis Mustassie Stewart, [place]arm.D.  Positive urine culture Enterobacter , E. Coli  []  Patient discharged without antimicrobial prescription and treatment is now indicated [x]  Organism is resistant to prescribed ED discharge antimicrobial []  Patient with positive blood cultures  Changes discussed with ED provider: Renne CriglerJoshua Geiple PA New antibiotic prescription Cipro 500mg  po bid x 7days [facility] to [facility] [address]  Contacted patient, 12/13/24 1354   Berle MullMiller, Elwin Tsou 12/14/2014, 1:52 PM

## 2016-03-17 ENCOUNTER — Encounter (HOSPITAL_COMMUNITY): Payer: Self-pay | Admitting: Emergency Medicine

## 2016-03-17 ENCOUNTER — Emergency Department (HOSPITAL_COMMUNITY): Payer: Self-pay

## 2016-03-17 ENCOUNTER — Emergency Department (HOSPITAL_COMMUNITY)
Admission: EM | Admit: 2016-03-17 | Discharge: 2016-03-17 | Disposition: A | Discharge status: Home / Self Care | Payer: Self-pay | Attending: Physician Assistant | Admitting: Physician Assistant

## 2016-03-17 DIAGNOSIS — R0781 Pleurodynia: Secondary | ICD-10-CM | POA: Insufficient documentation

## 2016-03-17 DIAGNOSIS — Y9389 Activity, other specified: Secondary | ICD-10-CM | POA: Insufficient documentation

## 2016-03-17 DIAGNOSIS — M549 Dorsalgia, unspecified: Secondary | ICD-10-CM

## 2016-03-17 DIAGNOSIS — Y990 Civilian activity done for income or pay: Secondary | ICD-10-CM | POA: Insufficient documentation

## 2016-03-17 DIAGNOSIS — X58XXXA Exposure to other specified factors, initial encounter: Secondary | ICD-10-CM | POA: Insufficient documentation

## 2016-03-17 DIAGNOSIS — Y9289 Other specified places as the place of occurrence of the external cause: Secondary | ICD-10-CM | POA: Insufficient documentation

## 2016-03-17 DIAGNOSIS — M546 Pain in thoracic spine: Secondary | ICD-10-CM | POA: Insufficient documentation

## 2016-03-17 MED ORDER — METHOCARBAMOL 500 MG PO TABS: 500.0000 mg | ORAL_TABLET | Freq: Two times a day (BID) | ORAL | 0 refills | Status: DC

## 2016-03-17 MED ORDER — IBUPROFEN 800 MG PO TABS: 800.0000 mg | ORAL_TABLET | Freq: Three times a day (TID) | ORAL | 0 refills | Status: DC

## 2016-03-17 MED ORDER — IBUPROFEN 400 MG PO TABS
800.0000 mg | ORAL_TABLET | Freq: Once | ORAL | Status: AC
  Administered 2016-03-17: 800 mg via ORAL
  Filled 2016-03-17: qty 2

## 2016-03-17 NOTE — ED Provider Notes (Signed)
[facility]-EMERGENCY DEPT Provider Note   CSN: [id number] Arrival date & time: 03/17/16  1859  By signing my name below, I, Aggie MoatsJenny Song, attest that this documentation has been prepared under the direction and in the presence of Sharilyn SitesLisa Kelisha Dall, PA-C. Electronically signed by: Aggie MoatsJenny Song, ED Scribe. 03/17/16. 7:45 PM.  History   Chief Complaint Chief Complaint  Patient presents with  . Back Pain   The history is provided by the patient. No language interpreter was used.   HPI Comments:  Cesar Warner is a 25 y.o. male who presents to the Emergency Department complaining of constant, moderate mid to upper left back/rib pain, which started today. Pt reports that pain started after taking trash out at work.  States he was slinging it up over the top of the dumpster.  Associated symptoms include pain with breathing and occasional radiation to anterior ribs. Pain worsens when standing; pt describes it as a "tightness." He has not taken any medications prior to arrival. Denies SOB or previous episode of similar symptoms. No recent injury.  History reviewed. No pertinent past medical history.  There are no active problems to display for this patient.   History reviewed. No pertinent surgical history.     Home Medications    Prior to Admission medications   Medication Sig Start Date End Date Taking? Authorizing Provider  amoxicillin (AMOXIL) 875 MG tablet Take 1 tablet (875 mg total) by mouth 2 (two) times daily. 12/11/12   Graylon GoodZachary H Baker, PA-C  DPH-Lido-AlHydr-MgHydr-Simeth SUSP Use as directed 5 mLs in the mouth or throat every hour as needed (gargle and spit). 12/11/12   Graylon GoodZachary H Baker, PA-C    Family History No family history on file.  Social History Social History  Substance Use Topics  . Smoking status: Never Smoker  . Smokeless tobacco: Never Used  . Alcohol use No     Allergies   Review of patient's allergies indicates no known allergies.   Review of Systems Review of  Systems  Respiratory: Negative for shortness of breath.   Musculoskeletal: Positive for back pain and myalgias.  All other systems reviewed and are negative.    Physical Exam Updated Vital Signs BP 115/80 (BP Location: Left Arm)   Pulse (!) 55   Temp 98 F (36.7 C) (Oral)   Resp 22   Ht 6\' 2"  (1.88 m)   Wt 178 lb 2 oz (80.8 kg)   SpO2 100%   BMI 22.87 kg/m   Physical Exam  Constitutional: He is oriented to person, place, and time. He appears well-developed and well-nourished.  HENT:  Head: Normocephalic and atraumatic.  Mouth/Throat: Oropharynx is clear and moist.  Eyes: Conjunctivae and EOM are normal. Pupils are equal, round, and reactive to light.  Neck: Normal range of motion.  Cardiovascular: Normal rate, regular rhythm and normal heart sounds.   Pulmonary/Chest: Effort normal and breath sounds normal.  Left lower lateral and posterior ribs and musculature are TTP; no deformities; apparent pain with deep breathing, no splinting, normal breath sounds bilaterally  Abdominal: Soft. Bowel sounds are normal.  Musculoskeletal: Normal range of motion.  Midline C/T/L spine non-tender  Neurological: He is alert and oriented to person, place, and time.  Skin: Skin is warm and dry.  Psychiatric: He has a normal mood and affect.  Nursing note and vitals reviewed.    ED Treatments / Results  DIAGNOSTIC STUDIES:  Oxygen Saturation is 100% on room air, normal by my interpretation.    COORDINATION OF  CARE:  7:43 PM Discussed treatment plan with pt at bedside, which includes x-ray of back and pain medication, and pt agreed to plan.  Labs (all labs ordered are listed, but only abnormal results are displayed) Labs Reviewed - No data to display  EKG  EKG Interpretation None       Radiology Dg Ribs Unilateral W/chest Left  Result Date: 03/17/2016 CLINICAL DATA:  25 y/o  M; left rib pain after taking out the trash. EXAM: LEFT RIBS AND CHEST - 3+ VIEW COMPARISON:  None.  FINDINGS: No fracture or other bone lesions are seen involving the ribs. There is no evidence of pneumothorax or pleural effusion. Both lungs are clear. Heart size and mediastinal contours are within normal limits. IMPRESSION: Negative. Electronically Signed   By: Mitzi Hansen M.D.   On: 03/17/2016 20:21    Procedures Procedures (including critical care time)  Medications Ordered in ED Medications - No data to display   Initial Impression / Assessment and Plan / ED Course  I have reviewed the triage vital signs and the nursing notes.  Pertinent labs & imaging results that were available during my care of the patient were reviewed by me and considered in my medical decision making (see chart for details).  Clinical Course   25 y.o. M here with left back/rib pain after taking trash out (swung it up over the dumpster at work).  Pain of left ribs and surrounding musculature on palpation.  Apparent pain with deep breathing, lung sounds remain clear.  No distress noted.  C/T/L spine non-tender.  Screening rib films obtained-- no acute fracture or pneumothorax.  Patient treated with Motrin here with some improvement of his symptoms. Likely muscular type injury.  Feels he is stable for discharge home with supportive care.  Discussed plan with patient, he acknowledged understanding and agreed with plan of care.  Return precautions given for new or worsening symptoms.  Final Clinical Impressions(s) / ED Diagnoses   Final diagnoses:  Back pain, unspecified location  Rib pain    New Prescriptions Discharge Medication List as of 03/17/2016  9:27 PM    START taking these medications   Details  ibuprofen (ADVIL,MOTRIN) 800 MG tablet Take 1 tablet (800 mg total) by mouth 3 (three) times daily., Starting Fri 03/17/2016, Print    methocarbamol (ROBAXIN) 500 MG tablet Take 1 tablet (500 mg total) by mouth 2 (two) times daily., Starting Fri 03/17/2016, Print       I personally performed the  services described in this documentation, which was scribed in my presence. The recorded information has been reviewed and is accurate.    Garlon Hatchet, PA-C 03/17/16 2156    Courteney Randall An, MD 03/17/16 2328

## 2016-03-17 NOTE — ED Triage Notes (Signed)
Pt c/o mid to upper left back pain, onset today after taking the trash out at work.  Pt st's pain increases with standing

## 2016-03-17 NOTE — Discharge Instructions (Signed)
Take the prescribed medication as directed. Sometimes heat will help pain as well-- hot shower, heating pad, etc. Return to the ED for new or worsening symptoms.

## 2016-12-10 ENCOUNTER — Encounter (HOSPITAL_COMMUNITY): Payer: Self-pay

## 2016-12-10 ENCOUNTER — Emergency Department (HOSPITAL_COMMUNITY)
Admission: EM | Admit: 2016-12-10 | Discharge: 2016-12-10 | Disposition: A | Discharge status: Home / Self Care | Payer: Self-pay | Attending: Emergency Medicine | Admitting: Emergency Medicine

## 2016-12-10 DIAGNOSIS — Z202 Contact with and (suspected) exposure to infections with a predominantly sexual mode of transmission: Secondary | ICD-10-CM | POA: Insufficient documentation

## 2016-12-10 MED ORDER — AZITHROMYCIN 250 MG PO TABS
1000.0000 mg | ORAL_TABLET | Freq: Once | ORAL | Status: AC
  Administered 2016-12-10: 1000 mg via ORAL
  Filled 2016-12-10: qty 4

## 2016-12-10 MED ORDER — CEFTRIAXONE SODIUM 250 MG IJ SOLR
250.0000 mg | Freq: Once | INTRAMUSCULAR | Status: AC
  Administered 2016-12-10: 250 mg via INTRAMUSCULAR
  Filled 2016-12-10: qty 250

## 2016-12-10 MED ORDER — LIDOCAINE HCL (PF) 1 % IJ SOLN
INTRAMUSCULAR | Status: AC
  Administered 2016-12-10: 2 mL
  Filled 2016-12-10: qty 5

## 2016-12-10 MED ORDER — LIDOCAINE HCL (CARDIAC) 20 MG/ML IV SOLN
INTRAVENOUS | Status: AC
  Filled 2016-12-10: qty 5

## 2016-12-10 NOTE — ED Notes (Signed)
Pt. Stated, I came because I want yall to tell me if I have STI . The person I was with has Chlamydia , it was 2 weeks ago.

## 2016-12-10 NOTE — ED Provider Notes (Signed)
[facility] Provider Note   CSN: [id number] Arrival date & time: 12/10/16  4098  By signing my name below, I, Rosana Fret, attest that this documentation has been prepared under the direction and in the presence of non-physician practitioner, Russo, [place], PA-C. Electronically Signed: Rosana Fret, ED Scribe. 12/10/16. 9:44 AM.  History   Chief Complaint Chief Complaint  Patient presents with  . Exposure to STD   The history is provided by the patient. No language interpreter was used.   HPI Comments: Cesar Warner is a 26 y.o. male who presents to the Emergency Department complaining of STD exposure that occurred 2 weeks ago. Pt states his partner was diagnosed with Chlamydia recently. Pt reports no associated symptoms. Pt denies diagnosis of Chlamydia in the past.  Pt denies fever, chills, dysuria, nausea, vomiting, penile discharge, testicle pain  or any other complaints at this time.  History reviewed. No pertinent past medical history.  There are no active problems to display for this patient.   History reviewed. No pertinent surgical history.     Home Medications    Prior to Admission medications   Medication Sig Start Date End Date Taking? Authorizing Provider  amoxicillin (AMOXIL) 875 MG tablet Take 1 tablet (875 mg total) by mouth 2 (two) times daily. 12/11/12   Graylon Good, PA-C  DPH-Lido-AlHydr-MgHydr-Simeth SUSP Use as directed 5 mLs in the mouth or throat every hour as needed (gargle and spit). 12/11/12   Excell Seltzer, Adrian Blackwater, PA-C  ibuprofen (ADVIL,MOTRIN) 800 MG tablet Take 1 tablet (800 mg total) by mouth 3 (three) times daily. 03/17/16   Garlon Hatchet, PA-C  methocarbamol (ROBAXIN) 500 MG tablet Take 1 tablet (500 mg total) by mouth 2 (two) times daily. 03/17/16   Garlon Hatchet, PA-C    Family History No family history on file.  Social History Social History  Substance Use Topics  . Smoking status: Never Smoker  . Smokeless tobacco: Never  Used  . Alcohol use No     Allergies   Patient has no known allergies.   Review of Systems Review of Systems  Constitutional: Negative for chills and fever.  Gastrointestinal: Negative for abdominal pain, nausea and vomiting.       No painful bowel movements  Genitourinary: Negative for discharge, dysuria and testicular pain.     Physical Exam Updated Vital Signs BP (!) 141/99 (BP Location: Left Arm)   Pulse (!) 48   Temp 98.1 F (36.7 C) (Oral)   Resp 16   Ht 6\' 2"  (1.88 m)   Wt 76.2 kg (168 lb)   SpO2 100%   BMI 21.57 kg/m   Physical Exam  Constitutional: He appears well-developed and well-nourished. No distress.  HENT:  Head: Normocephalic and atraumatic.  Eyes: Conjunctivae are normal.  Cardiovascular:  Slightly Bradycardic. (Pt's HR is similar in past visits to the ED)  Pulmonary/Chest: Effort normal.  Abdominal: Soft. Bowel sounds are normal. He exhibits no distension. There is no tenderness. There is no rebound and no guarding.  Genitourinary: Testes normal. Circumcised. No penile erythema or penile tenderness. No discharge found.  Genitourinary Comments: Exam performed with RN chaperone present. No penile tenderness. No discharge noted at the urethral meatus. GC/chlamydia swab collected.   Psychiatric: He has a normal mood and affect. His behavior is normal.  Nursing note and vitals reviewed.    ED Treatments / Results  DIAGNOSTIC STUDIES: Oxygen Saturation is 100% on RA, normal by my interpretation.   COORDINATION OF  CARE: 9:16 AM-Discussed next steps with pt including treatment with antibiotics and a swab test. Pt verbalized understanding and is agreeable with the plan.   Labs (all labs ordered are listed, but only abnormal results are displayed) Labs Reviewed  GC/CHLAMYDIA PROBE AMP ([place]) NOT AT [facility]    EKG  EKG Interpretation None       Radiology No results found.  Procedures Procedures (including critical care  time)  Medications Ordered in ED Medications  lidocaine (cardiac) 100 mg/705ml (XYLOCAINE) 20 MG/ML injection 2% (not administered)  azithromycin (ZITHROMAX) tablet 1,000 mg (1,000 mg Oral Given 12/10/16 0929)  cefTRIAXone (ROCEPHIN) injection 250 mg (250 mg Intramuscular Given 12/10/16 0929)  lidocaine (PF) (XYLOCAINE) 1 % injection (2 mLs  Given 12/10/16 0930)     Initial Impression / Assessment and Plan / ED Course  I have reviewed the triage vital signs and the nursing notes.  Pertinent labs & imaging results that were available during my care of the patient were reviewed by me and considered in my medical decision making (see chart for details).      Pt w STD exposure to chlamydia. Patient is afebrile without abdominal tenderness, abdominal pain or painful bowel movements to indicate prostatitis.  No tenderness to palpation of the testes or epididymis to suggest orchitis or epididymitis.  STD cultures obtained including gonorrhea and chlamydia. Pt declined HIV & RPR testing. Patient to be discharged with instructions to follow up with PCP. Discussed importance of using protection when sexually active. Pt understands that they have GC/Chlamydia cultures pending and that they will need to inform all sexual partners if results return positive. Patient has been treated prophylactically with azithromycin and Rocephin. Pt safe for discharge.   Discussed results, findings, treatment and follow up. Patient advised of return precautions. Patient verbalized understanding and agreed with plan.   Final Clinical Impressions(s) / ED Diagnoses   Final diagnoses:  Exposure to STD    New Prescriptions Discharge Medication List as of 12/10/2016  9:40 AM     I personally performed the services described in this documentation, which was scribed in my presence. The recorded information has been reviewed and is accurate.     Russo, [place]Jordan N, PA-C 12/10/16 1024    Bethann BerkshireZammit, Joseph, MD 12/10/16 [phone]

## 2016-12-10 NOTE — Discharge Instructions (Signed)
Please read the instructions below. You will receive a call from the hospital if your test results come back positive. Avoid sexual activity until you know your test results. If your results come back positive, it is important that you inform all of your sexual partners.  Return to the ER for new or worsening symptoms.

## 2016-12-10 NOTE — ED Triage Notes (Signed)
Per Pt, Pt is coming from home with noted exposure to Chlamydia.. Denies any symptoms.

## 2016-12-11 LAB — GC/CHLAMYDIA PROBE AMP (~~LOC~~) NOT AT ARMC
Chlamydia: POSITIVE — AB
Neisseria Gonorrhea: NEGATIVE

## 2017-11-26 ENCOUNTER — Encounter (HOSPITAL_COMMUNITY): Payer: Self-pay

## 2017-11-26 ENCOUNTER — Emergency Department (HOSPITAL_COMMUNITY)
Admission: EM | Admit: 2017-11-26 | Discharge: 2017-11-26 | Disposition: A | Discharge status: Home / Self Care | Payer: Self-pay | Attending: Emergency Medicine | Admitting: Emergency Medicine

## 2017-11-26 DIAGNOSIS — A5600 Chlamydial infection of lower genitourinary tract, unspecified: Secondary | ICD-10-CM | POA: Insufficient documentation

## 2017-11-26 DIAGNOSIS — A64 Unspecified sexually transmitted disease: Secondary | ICD-10-CM

## 2017-11-26 MED ORDER — CEFTRIAXONE SODIUM 250 MG IJ SOLR
250.0000 mg | Freq: Once | INTRAMUSCULAR | Status: AC
  Administered 2017-11-26: 250 mg via INTRAMUSCULAR
  Filled 2017-11-26: qty 250

## 2017-11-26 MED ORDER — AZITHROMYCIN 250 MG PO TABS
1000.0000 mg | ORAL_TABLET | Freq: Once | ORAL | Status: AC
  Administered 2017-11-26: 1000 mg via ORAL
  Filled 2017-11-26: qty 4

## 2017-11-26 NOTE — ED Provider Notes (Signed)
[facility] EMERGENCY DEPARTMENT Provider Note   CSN: [id number] Arrival date & time: 11/26/17  1159     History   Chief Complaint Chief Complaint  Patient presents with  . Exposure to STD    HPI Cesar Warner is a 27 y.o. male no significant past medical history presents emergency department today after testing positive for STD.  Patient states that he was tested at the health department and received a call back from the health department today I told him he tested positive for chlamydia.  He states he would like to receive no further evaluation. He is presenting because he was unable to wait in order to get treatment.  He denies any current symptoms.  No fever, chills, abdominal pain, nausea or vomiting, flank pain, urinary urinary urgency, dysuria, hematuria, penile discharge, penile pain, testicular pain/swelling, ulcers/lesions, painful bowel movements.  Patient is sexually active with one male partner does not always use protection.  She is aware he has tested positive.  HPI  History reviewed. No pertinent past medical history.  There are no active problems to display for this patient.   History reviewed. No pertinent surgical history.      Home Medications    Prior to Admission medications   Medication Sig Start Date End Date Taking? Authorizing Provider  amoxicillin (AMOXIL) 875 MG tablet Take 1 tablet (875 mg total) by mouth 2 (two) times daily. 12/11/12   Graylon Good, PA-C  DPH-Lido-AlHydr-MgHydr-Simeth SUSP Use as directed 5 mLs in the mouth or throat every hour as needed (gargle and spit). 12/11/12   Excell Seltzer, Adrian Blackwater, PA-C  ibuprofen (ADVIL,MOTRIN) 800 MG tablet Take 1 tablet (800 mg total) by mouth 3 (three) times daily. 03/17/16   Garlon Hatchet, PA-C  methocarbamol (ROBAXIN) 500 MG tablet Take 1 tablet (500 mg total) by mouth 2 (two) times daily. 03/17/16   Garlon Hatchet, PA-C    Family History No family history on file.  Social  History Social History   Tobacco Use  . Smoking status: Never Smoker  . Smokeless tobacco: Never Used  Substance Use Topics  . Alcohol use: No  . Drug use: No     Allergies   Patient has no known allergies.   Review of Systems Review of Systems  All other systems reviewed and are negative.    Physical Exam Updated Vital Signs BP 124/88 (BP Location: Right Arm)   Pulse 75   Temp 98.1 F (36.7 C) (Oral)   Resp 16   SpO2 100%   Physical Exam  Constitutional: He appears well-developed and well-nourished.  HENT:  Head: Normocephalic and atraumatic.  Right Ear: External ear normal.  Left Ear: External ear normal.  Nose: Nose normal.  Mouth/Throat: Uvula is midline, oropharynx is clear and moist and mucous membranes are normal. No tonsillar exudate.  Eyes: Pupils are equal, round, and reactive to light. Right eye exhibits no discharge. Left eye exhibits no discharge. No scleral icterus.  Neck: Trachea normal. Neck supple. No spinous process tenderness present. No neck rigidity. Normal range of motion present.  Cardiovascular: Normal rate, regular rhythm and intact distal pulses.  No murmur heard. Pulses:      Radial pulses are 2+ on the right side, and 2+ on the left side.       Dorsalis pedis pulses are 2+ on the right side, and 2+ on the left side.       Posterior tibial pulses are 2+ on the right  side, and 2+ on the left side.  No lower extremity swelling or edema. Calves symmetric in size bilaterally.  Pulmonary/Chest: Effort normal and breath sounds normal. He exhibits no tenderness.  Abdominal: Soft. Bowel sounds are normal. There is no tenderness. There is no rigidity, no rebound, no guarding and no CVA tenderness.  Musculoskeletal: He exhibits no edema.  Lymphadenopathy:    He has no cervical adenopathy.  Neurological: He is alert.  Skin: Skin is warm and dry. No rash noted. He is not diaphoretic.  Psychiatric: He has a normal mood and affect.  Nursing note  and vitals reviewed.    ED Treatments / Results  Labs (all labs ordered are listed, but only abnormal results are displayed) Labs Reviewed - No data to display  EKG None  Radiology No results found.  Procedures Procedures (including critical care time)  Medications Ordered in ED Medications  cefTRIAXone (ROCEPHIN) injection 250 mg (has no administration in time range)  azithromycin (ZITHROMAX) tablet 1,000 mg (has no administration in time range)     Initial Impression / Assessment and Plan / ED Course  I have reviewed the triage vital signs and the nursing notes.  Pertinent labs & imaging results that were available during my care of the patient were reviewed by me and considered in my medical decision making (see chart for details).     27 y.o. male resenting after testing positive for STDs.  He reports no symptoms at this current time.  He denies further testing for STDs at this time.  He denies any painful bowel movements or fever with me concern for prostatitis.  Patient was treated with azithromycin and ceftriaxone.  I recommended patient refrain from sexual activities for the next 7 days to prevent transmission.  Recommended that he notify all partners and sexual contact with in the previous 60 days.  Patient is to follow with health department for all future testing and treatments. Specific return precautions discussed. Time was given for all questions to be answered. The patient verbalized understanding and agreement with plan. The patient appears safe for discharge home.  Final Clinical Impressions(s) / ED Diagnoses   Final diagnoses:  STD (male)    ED Discharge Orders    None       Princella Pellegrini 11/26/17 1525    Maia Plan, MD 11/26/17 [phone]

## 2017-11-26 NOTE — ED Triage Notes (Signed)
Pt states he was seen at health dept and tested positive for chlamydia. He states they told him he would come on Tuesday to receive treatment, but he does not want to wait. Denies dysuria or drainage.

## 2017-11-26 NOTE — ED Notes (Signed)
Pt verbalized understanding discharge instructions and denies any further needs or questions at this time. VS stable, ambulatory and steady gait.   

## 2018-02-19 ENCOUNTER — Emergency Department (HOSPITAL_COMMUNITY)
Admission: EM | Admit: 2018-02-19 | Discharge: 2018-02-19 | Disposition: A | Discharge status: Home / Self Care | Payer: Self-pay | Attending: Emergency Medicine | Admitting: Emergency Medicine

## 2018-02-19 ENCOUNTER — Emergency Department (HOSPITAL_COMMUNITY): Payer: Worker's Compensation

## 2018-02-19 ENCOUNTER — Encounter (HOSPITAL_COMMUNITY): Payer: Self-pay | Admitting: *Deleted

## 2018-02-19 DIAGNOSIS — S51812A Laceration without foreign body of left forearm, initial encounter: Secondary | ICD-10-CM | POA: Insufficient documentation

## 2018-02-19 DIAGNOSIS — Y9389 Activity, other specified: Secondary | ICD-10-CM | POA: Insufficient documentation

## 2018-02-19 DIAGNOSIS — M545 Low back pain: Secondary | ICD-10-CM | POA: Insufficient documentation

## 2018-02-19 DIAGNOSIS — Y92410 Unspecified street and highway as the place of occurrence of the external cause: Secondary | ICD-10-CM | POA: Insufficient documentation

## 2018-02-19 DIAGNOSIS — S61512A Laceration without foreign body of left wrist, initial encounter: Secondary | ICD-10-CM | POA: Insufficient documentation

## 2018-02-19 DIAGNOSIS — S51811A Laceration without foreign body of right forearm, initial encounter: Secondary | ICD-10-CM | POA: Insufficient documentation

## 2018-02-19 DIAGNOSIS — Y998 Other external cause status: Secondary | ICD-10-CM | POA: Insufficient documentation

## 2018-02-19 MED ORDER — METHOCARBAMOL 500 MG PO TABS: 500.0000 mg | ORAL_TABLET | Freq: Two times a day (BID) | ORAL | 0 refills | Status: AC

## 2018-02-19 MED ORDER — LIDOCAINE-EPINEPHRINE (PF) 2 %-1:200000 IJ SOLN
10.0000 mL | Freq: Once | INTRAMUSCULAR | Status: AC
  Administered 2018-02-19: 10 mL via INTRADERMAL
  Filled 2018-02-19: qty 20

## 2018-02-19 MED ORDER — CEPHALEXIN 250 MG PO CAPS: 250.0000 mg | ORAL_CAPSULE | Freq: Two times a day (BID) | ORAL | 0 refills | Status: AC
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
## 2018-02-19 NOTE — ED Notes (Signed)
Patient back in the waiting room 

## 2018-02-19 NOTE — ED Notes (Signed)
See provider assessment 

## 2018-02-19 NOTE — Discharge Instructions (Signed)
Please have stitches (6) removed in 7-10 days. Please maintain area clean.You may apply triple antibiotic ointment. If you experience any fever, drainage from the wound please return to the ED.

## 2018-02-19 NOTE — ED Notes (Signed)
Patient transported to X-ray 
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
## 2018-02-26 ENCOUNTER — Other Ambulatory Visit: Payer: Self-pay

## 2018-02-26 ENCOUNTER — Emergency Department (HOSPITAL_COMMUNITY)
Admission: EM | Admit: 2018-02-26 | Discharge: 2018-02-26 | Disposition: A | Discharge status: Home / Self Care | Payer: Self-pay | Attending: Emergency Medicine | Admitting: Emergency Medicine

## 2018-02-26 ENCOUNTER — Encounter (HOSPITAL_COMMUNITY): Payer: Self-pay | Admitting: *Deleted

## 2018-02-26 DIAGNOSIS — Z4802 Encounter for removal of sutures: Secondary | ICD-10-CM | POA: Insufficient documentation
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
## 2018-02-26 NOTE — Discharge Instructions (Addendum)
Please read attached information. If you experience any new or worsening signs or symptoms please return to the emergency room for evaluation. Please follow-up with your primary care provider or specialist as discussed.  °

## 2018-02-26 NOTE — ED Notes (Signed)
Patient verbalized understanding of discharge instructions and denies any further needs or questions at this time. VS stable. Patient ambulatory with steady gait.  

## 2018-02-26 NOTE — ED Triage Notes (Signed)
Pt in for multiple suture removal to his left and right wrist/forearm, placed on 8/13

## 2019-09-16 ENCOUNTER — Emergency Department (HOSPITAL_COMMUNITY)
Admission: EM | Admit: 2019-09-16 | Discharge: 2019-09-16 | Discharge status: Against Medical Advice | Payer: Self-pay | Attending: Emergency Medicine | Admitting: Emergency Medicine

## 2019-09-16 ENCOUNTER — Other Ambulatory Visit: Payer: Self-pay

## 2019-09-16 ENCOUNTER — Encounter (HOSPITAL_COMMUNITY): Payer: Self-pay | Admitting: Emergency Medicine

## 2019-09-16 DIAGNOSIS — Z5321 Procedure and treatment not carried out due to patient leaving prior to being seen by health care provider: Secondary | ICD-10-CM | POA: Insufficient documentation

## 2019-09-16 DIAGNOSIS — M545 Low back pain: Secondary | ICD-10-CM | POA: Insufficient documentation

## 2019-09-16 NOTE — ED Triage Notes (Signed)
Pt reports lower back pain since yesterday. States after he eats anything, he throws it up. Denies abd pain.

## 2019-09-16 NOTE — ED Notes (Signed)
Pt did not want to stay. Advised pt that it is best to stay and be seen. Pt refused and gave stickers to shred.
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
## 2020-04-20 ENCOUNTER — Other Ambulatory Visit: Payer: Self-pay

## 2020-04-20 ENCOUNTER — Encounter (HOSPITAL_COMMUNITY): Payer: Self-pay | Admitting: Emergency Medicine

## 2020-04-20 ENCOUNTER — Emergency Department (HOSPITAL_COMMUNITY): Payer: Self-pay

## 2020-04-20 ENCOUNTER — Emergency Department (HOSPITAL_COMMUNITY)
Admission: EM | Admit: 2020-04-20 | Discharge: 2020-04-20 | Disposition: A | Discharge status: Home / Self Care | Payer: Self-pay | Attending: Emergency Medicine | Admitting: Emergency Medicine

## 2020-04-20 DIAGNOSIS — B349 Viral infection, unspecified: Secondary | ICD-10-CM

## 2020-04-20 DIAGNOSIS — Z20822 Contact with and (suspected) exposure to covid-19: Secondary | ICD-10-CM | POA: Insufficient documentation

## 2020-04-20 DIAGNOSIS — R197 Diarrhea, unspecified: Secondary | ICD-10-CM | POA: Insufficient documentation

## 2020-04-20 LAB — COMPREHENSIVE METABOLIC PANEL
ALT: 66 U/L — ABNORMAL HIGH (ref 0–44)
AST: 35 U/L (ref 15–41)
Albumin: 4.4 g/dL (ref 3.5–5.0)
Alkaline Phosphatase: 49 U/L (ref 38–126)
Anion gap: 11 (ref 5–15)
BUN: 8 mg/dL (ref 6–20)
CO2: 27 mmol/L (ref 22–32)
Calcium: 10 mg/dL (ref 8.9–10.3)
Chloride: 101 mmol/L (ref 98–111)
Creatinine, Ser: 1.2 mg/dL (ref 0.61–1.24)
GFR, Estimated: >60 mL/min (ref >60)
Glucose, Bld: 94 mg/dL (ref 70–99)
Potassium: 3.7 mmol/L (ref 3.5–5.1)
Sodium: 139 mmol/L (ref 135–145)
Total Bilirubin: 0.8 mg/dL (ref 0.3–1.2)
Total Protein: 8.1 g/dL (ref 6.5–8.1)

## 2020-04-20 LAB — CBC
HCT: 41 % (ref 39.0–52.0)
Hemoglobin: 14.4 g/dL (ref 13.0–17.0)
MCH: 29.8 pg (ref 26.0–34.0)
MCHC: 35.1 g/dL (ref 30.0–36.0)
MCV: 84.9 fL (ref 80.0–100.0)
Platelets: 236 10*3/uL (ref 150–400)
RBC: 4.83 MIL/uL (ref 4.22–5.81)
RDW: 12 % (ref 11.5–15.5)
WBC: 6 10*3/uL (ref 4.0–10.5)
nRBC: 0 % (ref 0.0–0.2)

## 2020-04-20 LAB — RESPIRATORY PANEL BY RT PCR (FLU A&B, COVID)
Influenza A by PCR: NEGATIVE
Influenza B by PCR: NEGATIVE
SARS Coronavirus 2 by RT PCR: NEGATIVE

## 2020-04-20 LAB — LIPASE, BLOOD: Lipase: 21 U/L (ref 11–51)

## 2020-04-20 MED ORDER — ONDANSETRON 4 MG PO TBDP
4.0000 mg | ORAL_TABLET | Freq: Once | ORAL | Status: AC
  Administered 2020-04-20: 4 mg via ORAL
  Filled 2020-04-20: qty 1

## 2020-04-20 MED ORDER — ALUM & MAG HYDROXIDE-SIMETH 200-200-20 MG/5ML PO SUSP
30.0000 mL | Freq: Once | ORAL | Status: AC
  Administered 2020-04-20: 30 mL via ORAL
  Filled 2020-04-20: qty 30

## 2020-04-20 MED ORDER — ACETAMINOPHEN 500 MG PO TABS
1000.0000 mg | ORAL_TABLET | Freq: Once | ORAL | Status: AC
  Administered 2020-04-20: 1000 mg via ORAL
  Filled 2020-04-20: qty 2

## 2020-04-20 MED ORDER — LIDOCAINE VISCOUS HCL 2 % MT SOLN
15.0000 mL | Freq: Once | OROMUCOSAL | Status: AC
  Administered 2020-04-20: 15 mL via ORAL
  Filled 2020-04-20: qty 15

## 2020-04-20 MED ORDER — IBUPROFEN 800 MG PO TABS
800.0000 mg | ORAL_TABLET | Freq: Once | ORAL | Status: AC
  Administered 2020-04-20: 800 mg via ORAL
  Filled 2020-04-20: qty 1

## 2020-04-20 MED ORDER — BENZONATATE 100 MG PO CAPS: 100.0000 mg | ORAL_CAPSULE | Freq: Three times a day (TID) | ORAL | 0 refills | Status: AC

## 2020-04-20 MED ORDER — ONDANSETRON 4 MG PO TBDP: 4.0000 mg | ORAL_TABLET | Freq: Three times a day (TID) | ORAL | 0 refills | Status: AC | PRN

## 2020-04-20 NOTE — ED Triage Notes (Signed)
Pt arrives to ED with complaints of upper abdominal pain/burning, headache, and runny nose. These symptoms started on Saturday night and have been constant. This morning pt states he vomited x1 and the emesis was red/brown colored. No CP or SOB.

## 2020-04-20 NOTE — ED Notes (Signed)
DC instructions and scrips reviewed with pt. Pt verbalized understanding.Pt DC 

## 2020-04-20 NOTE — ED Provider Notes (Signed)
[facility] EMERGENCY DEPARTMENT Provider Note   CSN: [id number] Arrival date & time: 04/20/20  1655     History Chief Complaint  Patient presents with  . Headache  . Abdominal Pain    Cesar Warner is a 29 y.o. male.  29 yo M with cc of nausea vomiting diarrhea.  Cough congestion fevers chills.  Had 2 episodes of trace bright red blood in his emesis earlier today.  Came in to be evaluated.  No significant shortness of breath.  No known sick contacts.  Having headaches off and on as well.  The history is provided by the patient.  Headache Associated symptoms: abdominal pain, diarrhea, nausea and vomiting   Associated symptoms: no congestion, no fever and no myalgias   Abdominal Pain Associated symptoms: diarrhea, nausea and vomiting   Associated symptoms: no chest pain, no chills, no fever and no shortness of breath   Illness Severity:  Moderate Onset quality:  Gradual Timing:  Constant Progression:  Worsening Chronicity:  New Associated symptoms: abdominal pain, diarrhea, headaches, nausea and vomiting   Associated symptoms: no chest pain, no congestion, no fever, no myalgias, no rash and no shortness of breath        History reviewed. No pertinent past medical history.  There are no problems to display for this patient.   History reviewed. No pertinent surgical history.     History reviewed. No pertinent family history.  Social History   Tobacco Use  . Smoking status: Never Smoker  . Smokeless tobacco: Never Used  Substance Use Topics  . Alcohol use: No  . Drug use: No    Home Medications Prior to Admission medications   Medication Sig Start Date End Date Taking? Authorizing Provider  benzonatate (TESSALON) 100 MG capsule Take 1 capsule (100 mg total) by mouth every 8 (eight) hours. 04/20/20   Melene Plan, DO  ondansetron (ZOFRAN ODT) 4 MG disintegrating tablet Take 1 tablet (4 mg total) by mouth every 8 (eight) hours as needed for  nausea or vomiting. 04/20/20   Melene Plan, DO    Allergies    Patient has no known allergies.  Review of Systems   Review of Systems  Constitutional: Negative for chills and fever.  HENT: Negative for congestion and facial swelling.   Eyes: Negative for discharge and visual disturbance.  Respiratory: Negative for shortness of breath.   Cardiovascular: Negative for chest pain and palpitations.  Gastrointestinal: Positive for abdominal pain, diarrhea, nausea and vomiting.  Musculoskeletal: Negative for arthralgias and myalgias.  Skin: Negative for color change and rash.  Neurological: Positive for headaches. Negative for tremors and syncope.  Psychiatric/Behavioral: Negative for confusion and dysphoric mood.    Physical Exam Updated Vital Signs BP 127/77 (BP Location: Right Arm)   Pulse 75   Temp 98.5 F (36.9 C) (Oral)   Resp 16   Ht 6\' 2"  (1.88 m)   SpO2 100%   BMI 21.83 kg/m   Physical Exam Vitals and nursing note reviewed.  Constitutional:      Appearance: He is well-developed.  HENT:     Head: Normocephalic and atraumatic.  Eyes:     Pupils: Pupils are equal, round, and reactive to light.  Neck:     Vascular: No JVD.  Cardiovascular:     Rate and Rhythm: Normal rate and regular rhythm.     Heart sounds: No murmur heard.  No friction rub. No gallop.   Pulmonary:     Effort: No respiratory  distress.     Breath sounds: No wheezing.  Abdominal:     General: There is no distension.     Tenderness: There is no guarding or rebound.  Musculoskeletal:        General: Normal range of motion.     Cervical back: Normal range of motion and neck supple.  Skin:    Coloration: Skin is not pale.     Findings: No rash.  Neurological:     Mental Status: He is alert and oriented to person, place, and time.  Psychiatric:        Behavior: Behavior normal.     ED Results / Procedures / Treatments   Labs (all labs ordered are listed, but only abnormal results are  displayed) Labs Reviewed  COMPREHENSIVE METABOLIC PANEL - Abnormal; Notable for the following components:      Result Value   ALT 66 (*)    All other components within normal limits  RESPIRATORY PANEL BY RT PCR (FLU A&B, COVID)  LIPASE, BLOOD  CBC  URINALYSIS, ROUTINE W REFLEX MICROSCOPIC    EKG EKG Interpretation  Date/Time:  Tuesday April 20 2020 13:27:13 EDT Ventricular Rate:  68 PR Interval:  160 QRS Duration: 90 QT Interval:  390 QTC Calculation: 414 R Axis:   92 Text Interpretation: Normal sinus rhythm with sinus arrhythmia Rightward axis Nonspecific T wave abnormality Abnormal ECG No old tracing to compare Confirmed by Melene Plan [phone]) on 04/20/2020 2:07:19 PM   Radiology DG Chest Port 1 View  Result Date: 04/20/2020 CLINICAL DATA:  Short of breath EXAM: PORTABLE CHEST 1 VIEW COMPARISON:  03/17/2016 FINDINGS: The heart size and mediastinal contours are within normal limits. Both lungs are clear. The visualized skeletal structures are unremarkable. IMPRESSION: No active disease. Electronically Signed   By: Marlan Palau M.D.   On: 04/20/2020 13:16    Procedures Procedures (including critical care time)  Medications Ordered in ED Medications  alum & mag hydroxide-simeth (MAALOX/MYLANTA) 200-200-20 MG/5ML suspension 30 mL (30 mLs Oral Given 04/20/20 1341)    And  lidocaine (XYLOCAINE) 2 % viscous mouth solution 15 mL (15 mLs Oral Given 04/20/20 1341)  ondansetron (ZOFRAN-ODT) disintegrating tablet 4 mg (4 mg Oral Given 04/20/20 1340)  acetaminophen (TYLENOL) tablet 1,000 mg (1,000 mg Oral Given 04/20/20 1341)  ibuprofen (ADVIL) tablet 800 mg (800 mg Oral Given 04/20/20 1341)    ED Course  I have reviewed the triage vital signs and the nursing notes.  Pertinent labs & imaging results that were available during my care of the patient were reviewed by me and considered in my medical decision making (see chart for details).    MDM Rules/Calculators/A&P                           29 yo M with a cc of cough congestion fevers nausea vomiting and diarrhea.  Coughing a lot for the past 4 to 5 days.  No known sick contacts.  He is here because he is concerned about 1 episode of hematemesis.  On my exam he is well-appearing nontoxic.  Stable vital signs.  Will obtain a chest x-ray Covid test.  Oral trial.  Chest x-ray viewed by me without focal left rib.  Lab work reassuring.  Hemoglobin is normal.  No significant LFT elevation lipase is normal.  Will discharge the patient home.  PCP follow-up.  Cesar Warner was evaluated in Emergency Department on 04/20/2020 for the symptoms described  in the history of present illness. He/she was evaluated in the context of the global COVID-19 pandemic, which necessitated consideration that the patient might be at risk for infection with the SARS-CoV-2 virus that causes COVID-19. Institutional protocols and algorithms that pertain to the evaluation of patients at risk for COVID-19 are in a state of rapid change based on information released by regulatory bodies including the CDC and federal and state organizations. These policies and algorithms were followed during the patient's care in the ED.   2:27 PM:  I have discussed the diagnosis/risks/treatment options with the patient and believe the pt to be eligible for discharge home to follow-up with PCP. We also discussed returning to the ED immediately if new or worsening sx occur. We discussed the sx which are most concerning (e.g., sob, inability to eat or drink) that necessitate immediate return. Medications administered to the patient during their visit and any new prescriptions provided to the patient are listed below.  Medications given during this visit Medications  alum & mag hydroxide-simeth (MAALOX/MYLANTA) 200-200-20 MG/5ML suspension 30 mL (30 mLs Oral Given 04/20/20 1341)    And  lidocaine (XYLOCAINE) 2 % viscous mouth solution 15 mL (15 mLs Oral Given 04/20/20 1341)   ondansetron (ZOFRAN-ODT) disintegrating tablet 4 mg (4 mg Oral Given 04/20/20 1340)  acetaminophen (TYLENOL) tablet 1,000 mg (1,000 mg Oral Given 04/20/20 1341)  ibuprofen (ADVIL) tablet 800 mg (800 mg Oral Given 04/20/20 1341)     The patient appears reasonably screen and/or stabilized for discharge and I doubt any other medical condition or other [facility] requiring further screening, evaluation, or treatment in the ED at this time prior to discharge.     Final Clinical Impression(s) / ED Diagnoses Final diagnoses:  Viral syndrome    Rx / DC Orders ED Discharge Orders         Ordered    ondansetron (ZOFRAN ODT) 4 MG disintegrating tablet  Every 8 hours PRN        04/20/20 1402    benzonatate (TESSALON) 100 MG capsule  Every 8 hours        04/20/20 1402           Melene Plan, DO 04/20/20 1427

## 2020-04-20 NOTE — Discharge Instructions (Signed)
Take tylenol 2 pills 4 times a day and motrin 4 pills 3 times a day.  Drink plenty of fluids.  Return for worsening shortness of breath, headache, confusion. Follow up with your family doctor.   

## 2020-04-30 ENCOUNTER — Ambulatory Visit (HOSPITAL_COMMUNITY)
Admission: EM | Admit: 2020-04-30 | Discharge: 2020-04-30 | Disposition: A | Discharge status: Home / Self Care | Payer: Self-pay | Attending: Family Medicine | Admitting: Family Medicine

## 2020-04-30 ENCOUNTER — Encounter (HOSPITAL_COMMUNITY): Payer: Self-pay | Admitting: Emergency Medicine

## 2020-04-30 ENCOUNTER — Other Ambulatory Visit: Payer: Self-pay

## 2020-04-30 DIAGNOSIS — B86 Scabies: Secondary | ICD-10-CM

## 2020-04-30 MED ORDER — IVERMECTIN 3 MG PO TABS: 17.0000 mg | ORAL_TABLET | Freq: Once | ORAL | 0 refills | Status: AC

## 2020-04-30 MED ORDER — PERMETHRIN 5 % EX CREA: TOPICAL_CREAM | CUTANEOUS | 0 refills | Status: AC

## 2020-04-30 NOTE — Discharge Instructions (Addendum)
Medication as prescribed to treat scabies Wash all clothing in hot water.  Apply the cream from the neck down and leave on for 8 to 12 hours.

## 2020-04-30 NOTE — ED Triage Notes (Signed)
Pt presents with rash on right arm and genital area xs 5 days. States was around someone that has had scabies.

## 2020-05-03 NOTE — ED Provider Notes (Signed)
[facility]    CSN: [id number] Arrival date & time: 04/30/20  0805      History   Chief Complaint Chief Complaint  Patient presents with  . Rash    POSS SCABIES    HPI Cesar Warner is a 29 y.o. male.   Patient is a 29 year old male who presents today with rash.  Rash located to right forearm and genital area x5 days.  The rash is very itchy.  Reporting was exposed to scabies.  Denies any fever, joint pain. Denies any recent changes in lotions, detergents, foods or other possible irritants. No recent travel. Patient has been outside but denies any contact with plants or insects. No new foods or medications.        History reviewed. No pertinent past medical history.  There are no problems to display for this patient.   History reviewed. No pertinent surgical history.     Home Medications    Prior to Admission medications   Medication Sig Start Date End Date Taking? Authorizing Provider  benzonatate (TESSALON) 100 MG capsule Take 1 capsule (100 mg total) by mouth every 8 (eight) hours. 04/20/20   Melene Plan, DO  ondansetron (ZOFRAN ODT) 4 MG disintegrating tablet Take 1 tablet (4 mg total) by mouth every 8 (eight) hours as needed for nausea or vomiting. 04/20/20   Melene Plan, DO  permethrin (ELIMITE) 5 % cream Apply to affected area once 04/30/20   Janace Aris, NP    Family History History reviewed. No pertinent family history.  Social History Social History   Tobacco Use  . Smoking status: Never Smoker  . Smokeless tobacco: Never Used  Substance Use Topics  . Alcohol use: No  . Drug use: No     Allergies   Patient has no known allergies.   Review of Systems Review of Systems   Physical Exam Triage Vital Signs ED Triage Vitals  Enc Vitals Group     BP 04/30/20 0852 124/85     Pulse Rate 04/30/20 0852 84     Resp 04/30/20 0852 16     Temp 04/30/20 0852 98.5 F (36.9 C)     Temp Source 04/30/20 0852 Oral     SpO2 04/30/20  0852 99 %     Weight --      Height --      Head Circumference --      Peak Flow --      Pain Score 04/30/20 0849 0     Pain Loc --      Pain Edu? --      Excl. in GC? --    No data found.  Updated Vital Signs BP 124/85 (BP Location: Left Arm)   Pulse 84   Temp 98.5 F (36.9 C) (Oral)   Resp 16   SpO2 99%   Visual Acuity Right Eye Distance:   Left Eye Distance:   Bilateral Distance:    Right Eye Near:   Left Eye Near:    Bilateral Near:     Physical Exam Vitals and nursing note reviewed.  Constitutional:      Appearance: Normal appearance.  HENT:     Head: Normocephalic and atraumatic.     Nose: Nose normal.  Eyes:     Conjunctiva/sclera: Conjunctivae normal.  Pulmonary:     Effort: Pulmonary effort is normal.  Musculoskeletal:        General: Normal range of motion.     Cervical back: Normal range  of motion.  Skin:    General: Skin is warm and dry.     Findings: Rash present.  Neurological:     Mental Status: He is alert.  Psychiatric:        Mood and Affect: Mood normal.      UC Treatments / Results  Labs (all labs ordered are listed, but only abnormal results are displayed) Labs Reviewed - No data to display  EKG   Radiology No results found.  Procedures Procedures (including critical care time)  Medications Ordered in UC Medications - No data to display  Initial Impression / Assessment and Plan / UC Course  I have reviewed the triage vital signs and the nursing notes.  Pertinent labs & imaging results that were available during my care of the patient were reviewed by me and considered in my medical decision making (see chart for details).     Scabies Rash consistent with this. Treating with ivermectin and permethrin cream Recommended wash all clothing however. Instructions on how to take medication given Final Clinical Impressions(s) / UC Diagnoses   Final diagnoses:  Scabies     Discharge Instructions     Medication as  prescribed to treat scabies Wash all clothing in hot water.  Apply the cream from the neck down and leave on for 8 to 12 hours.     ED Prescriptions    Medication Sig Dispense Auth. Provider   ivermectin (STROMECTOL) 3 MG TABS tablet Take 5.5 tablets (16.5 mg total) by mouth once for 1 dose. 6 tablet Tannia Contino A, NP   permethrin (ELIMITE) 5 % cream Apply to affected area once 60 g Yair Dusza A, NP     PDMP not reviewed this encounter.   Janace Aris, NP 05/03/20 [phone]

## 2020-05-11 ENCOUNTER — Other Ambulatory Visit: Payer: Self-pay

## 2020-05-11 ENCOUNTER — Emergency Department (HOSPITAL_COMMUNITY)
Admission: EM | Admit: 2020-05-11 | Discharge: 2020-05-12 | Disposition: A | Discharge status: Home / Self Care | Payer: Self-pay | Attending: Emergency Medicine | Admitting: Emergency Medicine

## 2020-05-11 ENCOUNTER — Ambulatory Visit (HOSPITAL_COMMUNITY)
Admission: EM | Admit: 2020-05-11 | Discharge: 2020-05-11 | Disposition: A | Discharge status: Home / Self Care | Payer: Self-pay

## 2020-05-11 DIAGNOSIS — Y9361 Activity, american tackle football: Secondary | ICD-10-CM | POA: Insufficient documentation

## 2020-05-11 DIAGNOSIS — S01111A Laceration without foreign body of right eyelid and periocular area, initial encounter: Secondary | ICD-10-CM | POA: Insufficient documentation

## 2020-05-11 DIAGNOSIS — W2131XA Struck by shoe cleats, initial encounter: Secondary | ICD-10-CM | POA: Insufficient documentation

## 2020-05-11 DIAGNOSIS — Y929 Unspecified place or not applicable: Secondary | ICD-10-CM | POA: Insufficient documentation

## 2020-05-11 DIAGNOSIS — Y999 Unspecified external cause status: Secondary | ICD-10-CM | POA: Insufficient documentation

## 2020-05-11 NOTE — ED Triage Notes (Signed)
Pt presents with laceration to distal aspect of right upper eyelid, through lash line, sustained from a cleat a couple hours ago.  No active bleeding.  Some swelling and hematoma noted to right eyeball.  Pt denies any vision changes, states eyeball just feels irritated from laceration rubbing. Dr Tracie Harrier evaluated wound; recommend pt go to ED for further treatment.  Pt verbalized understanding.

## 2020-05-11 NOTE — ED Triage Notes (Signed)
C/o laceration on right eyelid from cleats, denies vision issue. Reported tetanus vaccine within a year.

## 2020-05-12 MED ORDER — ACETAMINOPHEN 325 MG PO TABS
650.0000 mg | ORAL_TABLET | Freq: Once | ORAL | Status: DC
Start: 2020-05-12 — End: 2020-05-12
  Filled 2020-05-12: qty 2

## 2020-05-12 MED ORDER — FLUORESCEIN SODIUM 1 MG OP STRP
1.0000 | ORAL_STRIP | Freq: Once | OPHTHALMIC | Status: AC
  Administered 2020-05-12: 1 via OPHTHALMIC
  Filled 2020-05-12: qty 1

## 2020-05-12 MED ORDER — ERYTHROMYCIN 5 MG/GM OP OINT
1.0000 "application " | TOPICAL_OINTMENT | Freq: Once | OPHTHALMIC | Status: AC
  Administered 2020-05-12: 1 via OPHTHALMIC
  Filled 2020-05-12: qty 3.5

## 2020-05-12 MED ORDER — TETRACAINE HCL 0.5 % OP SOLN
2.0000 [drp] | Freq: Once | OPHTHALMIC | Status: AC
  Administered 2020-05-12: 2 [drp] via OPHTHALMIC
  Filled 2020-05-12: qty 4

## 2020-05-12 NOTE — Discharge Instructions (Addendum)
Please follow-up with Dr. Donney Dice of oculoplastics this is a type of doctor who can help repair your eyelid. It is very important to keep this area lubricated with the ointment that I have provided you with. You may use this every 6 hours.   It is extremely important to follow-up tomorrow as these injuries, if not repaired correctly, can lead to long-term eye problems.

## 2020-05-12 NOTE — ED Provider Notes (Signed)
Attestation: Medical screening examination/treatment/procedure(s) were conducted as a shared visit with non-physician practitioner(s) and myself.  I personally evaluated the patient during the encounter.   Briefly, the patient is a 29 y.o. male with here for right eyelid laceration that occurred while playing football.  Vitals:   05/11/20 2136 05/12/20 0252  BP: 125/79 130/80  Pulse: 84 90  Resp: 17 18  Temp:  98.4 F (36.9 C)  SpO2: 99% 100%    CONSTITUTIONAL:  well-appearing, NAD NEURO:  Alert and oriented x 3, no focal deficits EYES:  pupils equal and reactive, subconjunctival hemorrhage to right eye. Upper lid with full thickness laceration through the edges with missing tissue. ENT/NECK:  trachea midline, no JVD CARDIO:  reg rate, reg rhythm, well-perfused PULM:  None labored breathing GI/GU:  Abdomen non-distended MSK/SPINE:  No gross deformities, no edema SKIN:  no rash, atraumatic PSYCH:  Appropriate speech and behavior   EKG Interpretation  Date/Time:    Ventricular Rate:    PR Interval:    QRS Duration:   QT Interval:    QTC Calculation:   R Axis:     Text Interpretation:        No significant ocular trauma noted. ophtho conuslted for laceration given difficulty, who felt patient will need oculoplasty. DC with referral.      Nira Conn, MD 05/12/20 [phone]

## 2020-05-26 NOTE — ED Provider Notes (Signed)
[facility] EMERGENCY DEPARTMENT Provider Note   CSN: [id number] Arrival date & time: 05/11/20  1813     History Chief Complaint  Patient presents with  . Laceration    Right eyelid    Cesar Warner is a 28 y.o. male.  HPI Patient here with laceration of the right upper eyelid after cleat impact to face during football practice today. Laceration occurred approximately 2 hours ago.  He was wearing his helmet when the incident occurred and states that he was tackling his teammate from behind when one of the cleat metal tips slipped between his visor slats on his helmet and caught his right upper eyelid. He has had some pain that is mild and now resolved. Bleeding was minimal/none.   He denies any vision changes, double vision or blurry vision. No neck, back or extremity pain. Denies any LOC, NV headache  He is UTD on tetanus.    No past medical history on file.  There are no problems to display for this patient.   No past surgical history on file.     No family history on file.  Social History   Tobacco Use  . Smoking status: Never Smoker  . Smokeless tobacco: Never Used  Substance Use Topics  . Alcohol use: No  . Drug use: No    Home Medications Prior to Admission medications   Medication Sig Start Date End Date Taking? Authorizing Provider  benzonatate (TESSALON) 100 MG capsule Take 1 capsule (100 mg total) by mouth every 8 (eight) hours. 04/20/20   Melene Plan, DO  ondansetron (ZOFRAN ODT) 4 MG disintegrating tablet Take 1 tablet (4 mg total) by mouth every 8 (eight) hours as needed for nausea or vomiting. 04/20/20   Melene Plan, DO  permethrin (ELIMITE) 5 % cream Apply to affected area once 04/30/20   Janace Aris, NP    Allergies    Patient has no known allergies.  Review of Systems   Review of Systems  Constitutional: Negative for chills and fever.  HENT: Negative for congestion.   Eyes:       Right upper eyelid pain    Respiratory: Negative for shortness of breath.   Cardiovascular: Negative for chest pain.  Gastrointestinal: Negative for abdominal pain.  Musculoskeletal: Negative for neck pain.    Physical Exam Updated Vital Signs BP 130/80 (BP Location: Right Arm)   Pulse 90   Temp 98.4 F (36.9 C) (Oral)   Resp 18   SpO2 100%   Physical Exam Vitals and nursing note reviewed.  Constitutional:      General: He is not in acute distress.    Appearance: Normal appearance. He is not ill-appearing.  HENT:     Head: Normocephalic and atraumatic.     Mouth/Throat:     Mouth: Mucous membranes are moist.  Eyes:     General: No scleral icterus.       Right eye: No discharge.        Left eye: No discharge.     Extraocular Movements: Extraocular movements intact.     Conjunctiva/sclera: Conjunctivae normal.     Pupils: Pupils are equal, round, and reactive to light.     Comments: Subconjunctival hemorrhage of the right eye in the lateral aspect.  No hyphema  No fluorescien uptake on exam with woods lamp.  No obvious corneal injury.   RIGHT UPPER EYELID with small triangular defect in the central upper eyelid at the margin of the eyelid. No  bleeding. Area is approximately 51mm x 17mm V-shaped  Pulmonary:     Effort: Pulmonary effort is normal.     Breath sounds: No stridor.  Musculoskeletal:     Comments: No bony tenderness over joints or long bones of the upper and lower extremities.     No neck or back midline tenderness, step-off, deformity, or bruising. Able to turn head left and right 45 degrees without difficulty.  Full range of motion of upper and lower extremity joints shown after palpation was conducted; with 5/5 symmetrical strength in upper and lower extremities. No chest wall tenderness, no facial or cranial tenderness.   Patient has intact sensation grossly in lower and upper extremities. Intact patellar and ankle reflexes. Patient able to ambulate without difficulty.  Radial and  DP pulses palpated BL.   Neurological:     Mental Status: He is alert and oriented to person, place, and time. Mental status is at baseline.     Comments: Moves all 4 extremities.  Good sensation in all 4 extremities CN II-XII intact AOx3         ED Results / Procedures / Treatments   Labs (all labs ordered are listed, but only abnormal results are displayed) Labs Reviewed - No data to display  EKG None  Radiology No results found.  Procedures Procedures (including critical care time)  Medications Ordered in ED Medications  fluorescein ophthalmic strip 1 strip (1 strip Right Eye Given 05/12/20 0221)  tetracaine (PONTOCAINE) 0.5 % ophthalmic solution 2 drop (2 drops Right Eye Given 05/12/20 0221)  erythromycin ophthalmic ointment 1 application (1 application Right Eye Given 05/12/20 0244)    ED Course  I have reviewed the triage vital signs and the nursing notes.  Pertinent labs & imaging results that were available during my care of the patient were reviewed by me and considered in my medical decision making (see chart for details).    MDM Rules/Calculators/A&P                          Patient here with laceration of the right upper eyelid after cleat impact to face during football practice today. Laceration occurred approximately 2 hours ago.   He is UTD on tetanus. Denies any LOC, NV headache and has a reasurringly normal neurologic exam. Also has normal MSK exam with no swelling, bruising or bony TTP of the face, skull, C-spine (full trauma exam WNLs).   Because of functional and cosmetic importance of the eyelid I discussed with my attending Dr. Eudelia Bunch who recommends ophthalmology consultation.  Patient was seen by Dr. Georga Hacking at bedside who appraised no damage to the eye but indicated that the upper eyelid laceration has a small avulsion that will require a complex closure with possibly an extension of the defect to create a proper closure. He recommends DC with  erythromycin ointment for lubrication and protection of eye and follow up with oculoplastics Dr. Westley Foots tomorrow morning.  Dr. Georga Hacking and myself made clear to the patient the importance of close follow up and the long-term ocular issues that a deformed eyelid could cause. The following information was included in the patient's DC paperwork:  "Please follow-up with Dr. Donney Dice of oculoplastics this is a type of doctor who can help repair your eyelid. It is very important to keep this area lubricated with the ointment that I have provided you with. You may use this every 6 hours. It is extremely important to follow-up tomorrow  as these injuries, if not repaired correctly, can lead to long-term eye problems."  Final Clinical Impression(s) / ED Diagnoses Final diagnoses:  Right eyelid laceration, initial encounter    Rx / DC Orders ED Discharge Orders    None       Gailen Shelter, [place] 05/26/20 0048    Nira Conn, MD 06/02/20 [phone]

## 2022-03-12 IMAGING — CR DG CHEST 1V PORT
1 series · 1 of 1 positions shown · non-contrast
Comparison: 03/17/2016

CLINICAL DATA: Short of breath

EXAM:
PORTABLE CHEST 1 VIEW

[chest ap]
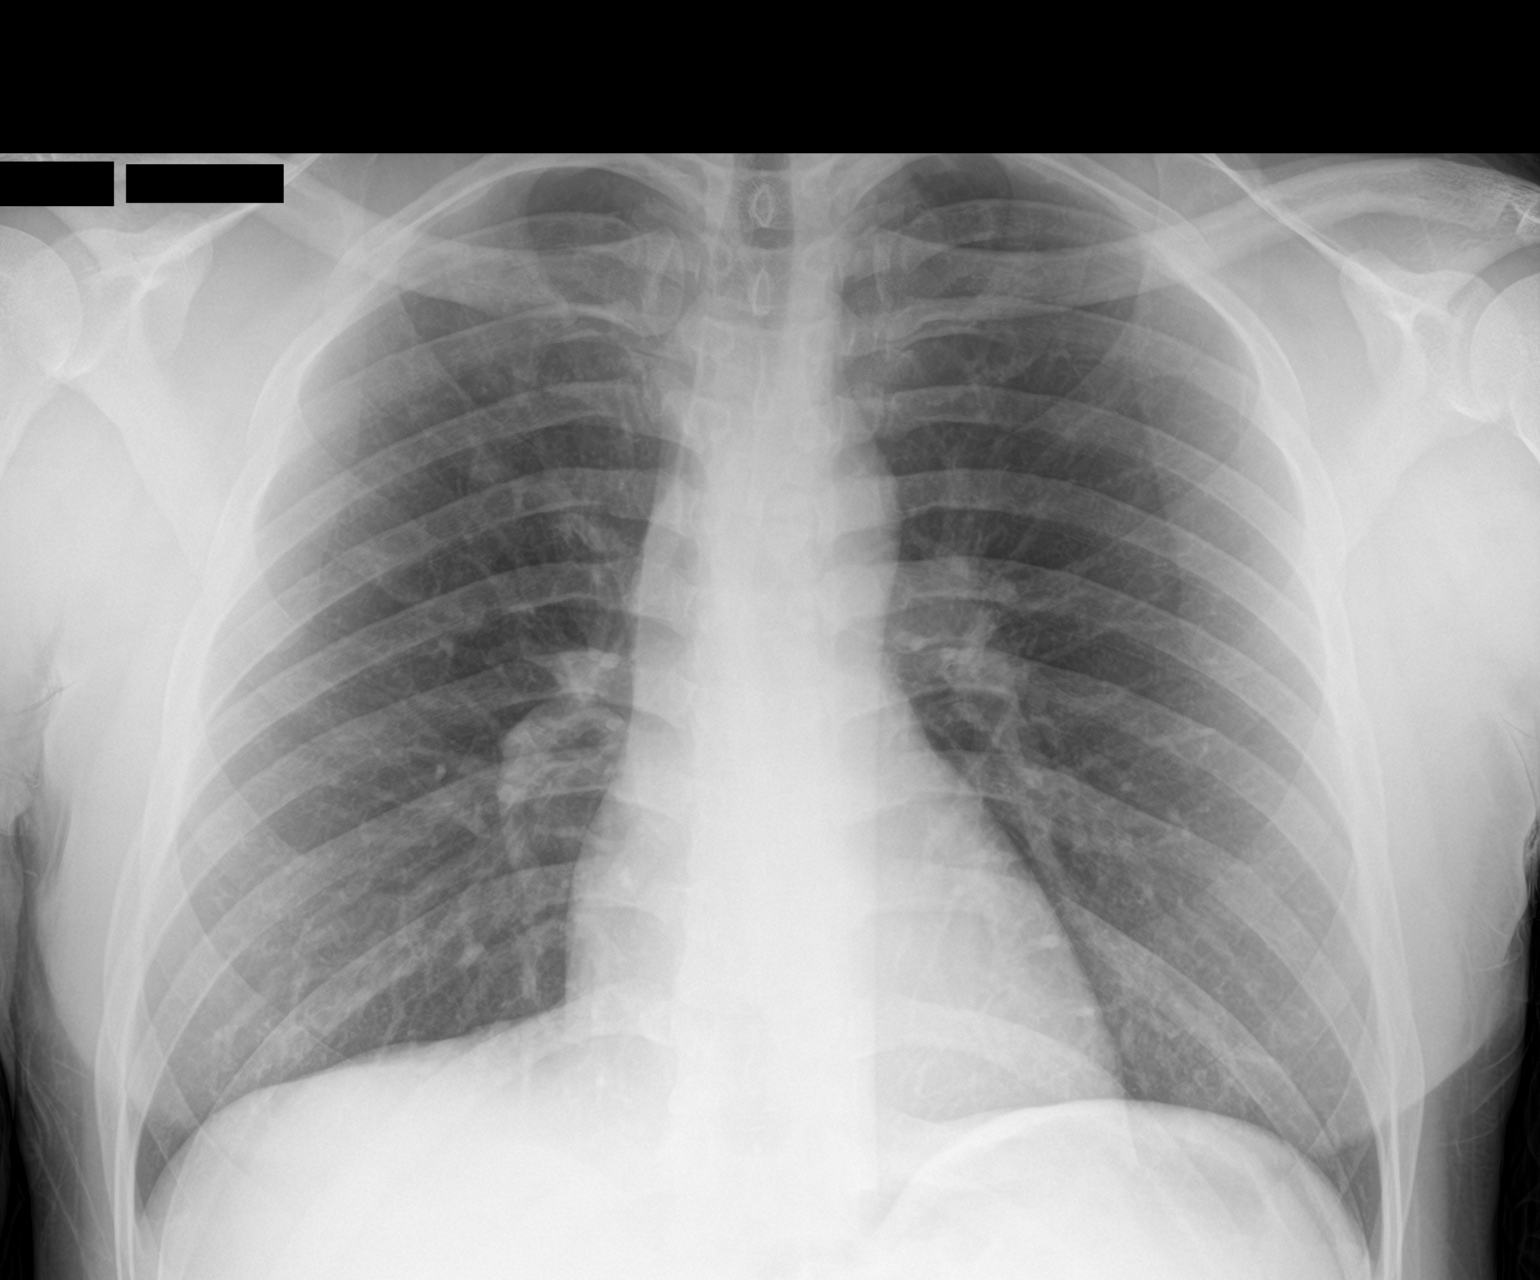

[1 of 1 positions shown; findings below may reference images not displayed]

FINDINGS: The heart size and mediastinal contours are within normal limits.
Both lungs are clear. The visualized skeletal structures are
unremarkable.
IMPRESSION: No active disease.
